# Patient Record
Sex: Female | Born: 1965 | Race: White | Hispanic: No | Marital: Single | Smoking: Former smoker
Health system: Southern US, Community
[De-identification: ages and names within clinical notes are randomized; demographics above are authoritative.]

## PROBLEM LIST (undated history)

## (undated) DIAGNOSIS — F0781 Postconcussional syndrome: Secondary | ICD-10-CM

## (undated) DIAGNOSIS — E7800 Pure hypercholesterolemia, unspecified: Secondary | ICD-10-CM

## (undated) DIAGNOSIS — S069XAA Unspecified intracranial injury with loss of consciousness status unknown, initial encounter: Secondary | ICD-10-CM

## (undated) DIAGNOSIS — S060XAA Concussion with loss of consciousness status unknown, initial encounter: Secondary | ICD-10-CM

## (undated) DIAGNOSIS — G4700 Insomnia, unspecified: Secondary | ICD-10-CM

## (undated) DIAGNOSIS — Z833 Family history of diabetes mellitus: Secondary | ICD-10-CM

## (undated) DIAGNOSIS — J45909 Unspecified asthma, uncomplicated: Secondary | ICD-10-CM

## (undated) DIAGNOSIS — J45990 Exercise induced bronchospasm: Secondary | ICD-10-CM

## (undated) DIAGNOSIS — R531 Weakness: Secondary | ICD-10-CM

## (undated) DIAGNOSIS — Z8249 Family history of ischemic heart disease and other diseases of the circulatory system: Secondary | ICD-10-CM

## (undated) DIAGNOSIS — I10 Essential (primary) hypertension: Secondary | ICD-10-CM

## (undated) DIAGNOSIS — Z9109 Other allergy status, other than to drugs and biological substances: Secondary | ICD-10-CM

## (undated) DIAGNOSIS — N898 Other specified noninflammatory disorders of vagina: Secondary | ICD-10-CM

## (undated) DIAGNOSIS — E669 Obesity, unspecified: Secondary | ICD-10-CM

## (undated) DIAGNOSIS — K589 Irritable bowel syndrome without diarrhea: Secondary | ICD-10-CM

## (undated) DIAGNOSIS — S060X9A Concussion with loss of consciousness of unspecified duration, initial encounter: Secondary | ICD-10-CM

## (undated) DIAGNOSIS — S069X9A Unspecified intracranial injury with loss of consciousness of unspecified duration, initial encounter: Secondary | ICD-10-CM

## (undated) HISTORY — DX: Irritable bowel syndrome, unspecified: K58.9

## (undated) HISTORY — DX: Concussion with loss of consciousness of unspecified duration, initial encounter: S06.0X9A

## (undated) HISTORY — DX: Family history of diabetes mellitus: Z83.3

## (undated) HISTORY — DX: Obesity, unspecified: E66.9

## (undated) HISTORY — DX: Other allergy status, other than to drugs and biological substances: Z91.09

## (undated) HISTORY — DX: Pure hypercholesterolemia, unspecified: E78.00

## (undated) HISTORY — DX: Concussion with loss of consciousness status unknown, initial encounter: S06.0XAA

## (undated) HISTORY — DX: Exercise induced bronchospasm: J45.990

## (undated) HISTORY — DX: Postconcussional syndrome: F07.81

## (undated) HISTORY — DX: Family history of ischemic heart disease and other diseases of the circulatory system: Z82.49

## (undated) HISTORY — DX: Weakness: R53.1

## (undated) HISTORY — DX: Insomnia, unspecified: G47.00

## (undated) HISTORY — PX: TUBAL LIGATION: SHX77

## (undated) HISTORY — DX: Other specified noninflammatory disorders of vagina: N89.8

---

## 1998-03-07 ENCOUNTER — Ambulatory Visit (HOSPITAL_COMMUNITY): Admission: RE | Admit: 1998-03-07 | Discharge: 1998-03-07 | Payer: Self-pay | Admitting: Obstetrics and Gynecology

## 2000-03-29 ENCOUNTER — Other Ambulatory Visit: Admission: RE | Admit: 2000-03-29 | Discharge: 2000-03-29 | Payer: Self-pay | Admitting: Obstetrics and Gynecology

## 2001-03-08 ENCOUNTER — Ambulatory Visit (HOSPITAL_BASED_OUTPATIENT_CLINIC_OR_DEPARTMENT_OTHER): Admission: RE | Admit: 2001-03-08 | Discharge: 2001-03-08 | Payer: Self-pay | Admitting: Orthopedic Surgery

## 2001-04-10 ENCOUNTER — Other Ambulatory Visit: Admission: RE | Admit: 2001-04-10 | Discharge: 2001-04-10 | Payer: Self-pay | Admitting: Obstetrics and Gynecology

## 2001-10-19 ENCOUNTER — Emergency Department (HOSPITAL_COMMUNITY): Admission: EM | Admit: 2001-10-19 | Discharge: 2001-10-19 | Payer: Self-pay

## 2001-10-20 ENCOUNTER — Encounter: Payer: Self-pay | Admitting: Emergency Medicine

## 2002-07-11 ENCOUNTER — Other Ambulatory Visit: Admission: RE | Admit: 2002-07-11 | Discharge: 2002-07-11 | Payer: Self-pay | Admitting: Obstetrics and Gynecology

## 2003-02-11 ENCOUNTER — Inpatient Hospital Stay (HOSPITAL_COMMUNITY): Admission: EM | Admit: 2003-02-11 | Discharge: 2003-03-05 | Payer: Self-pay

## 2003-02-11 ENCOUNTER — Encounter: Payer: Self-pay | Admitting: General Surgery

## 2003-02-20 ENCOUNTER — Encounter: Payer: Self-pay | Admitting: Surgery

## 2003-03-05 ENCOUNTER — Inpatient Hospital Stay (HOSPITAL_COMMUNITY)
Admission: RE | Admit: 2003-03-05 | Discharge: 2003-04-17 | Payer: Self-pay | Admitting: Physical Medicine & Rehabilitation

## 2003-04-24 ENCOUNTER — Encounter
Admission: RE | Admit: 2003-04-24 | Discharge: 2003-05-31 | Payer: Self-pay | Admitting: Physical Medicine & Rehabilitation

## 2003-05-22 ENCOUNTER — Encounter
Admission: RE | Admit: 2003-05-22 | Discharge: 2003-08-20 | Payer: Self-pay | Admitting: Physical Medicine & Rehabilitation

## 2003-06-01 ENCOUNTER — Encounter
Admission: RE | Admit: 2003-06-01 | Discharge: 2003-08-30 | Payer: Self-pay | Admitting: Physical Medicine & Rehabilitation

## 2003-09-12 ENCOUNTER — Encounter
Admission: RE | Admit: 2003-09-12 | Discharge: 2003-12-11 | Payer: Self-pay | Admitting: Physical Medicine & Rehabilitation

## 2004-04-27 ENCOUNTER — Encounter
Admission: RE | Admit: 2004-04-27 | Discharge: 2004-07-23 | Payer: Self-pay | Admitting: Physical Medicine & Rehabilitation

## 2004-04-28 ENCOUNTER — Ambulatory Visit: Payer: Self-pay | Admitting: Physical Medicine & Rehabilitation

## 2004-07-09 ENCOUNTER — Encounter: Admission: RE | Admit: 2004-07-09 | Discharge: 2004-10-07 | Payer: Self-pay | Admitting: Psychology

## 2004-07-23 ENCOUNTER — Encounter
Admission: RE | Admit: 2004-07-23 | Discharge: 2004-10-16 | Payer: Self-pay | Admitting: Physical Medicine & Rehabilitation

## 2004-07-27 ENCOUNTER — Ambulatory Visit: Payer: Self-pay | Admitting: Physical Medicine & Rehabilitation

## 2004-09-12 ENCOUNTER — Emergency Department (HOSPITAL_COMMUNITY): Admission: EM | Admit: 2004-09-12 | Discharge: 2004-09-12 | Payer: Self-pay | Admitting: Emergency Medicine

## 2004-10-16 ENCOUNTER — Encounter
Admission: RE | Admit: 2004-10-16 | Discharge: 2005-01-14 | Payer: Self-pay | Admitting: Physical Medicine & Rehabilitation

## 2004-10-20 ENCOUNTER — Ambulatory Visit: Payer: Self-pay | Admitting: Physical Medicine & Rehabilitation

## 2005-06-01 ENCOUNTER — Ambulatory Visit: Payer: Self-pay | Admitting: Physical Medicine & Rehabilitation

## 2005-06-01 ENCOUNTER — Encounter
Admission: RE | Admit: 2005-06-01 | Discharge: 2005-08-30 | Payer: Self-pay | Admitting: Physical Medicine & Rehabilitation

## 2005-07-08 ENCOUNTER — Ambulatory Visit: Payer: Self-pay | Admitting: Physical Medicine & Rehabilitation

## 2005-08-05 ENCOUNTER — Ambulatory Visit: Payer: Self-pay | Admitting: Physical Medicine & Rehabilitation

## 2005-11-02 ENCOUNTER — Encounter
Admission: RE | Admit: 2005-11-02 | Discharge: 2006-01-31 | Payer: Self-pay | Admitting: Physical Medicine & Rehabilitation

## 2005-11-02 ENCOUNTER — Ambulatory Visit: Payer: Self-pay | Admitting: Physical Medicine & Rehabilitation

## 2005-12-31 ENCOUNTER — Ambulatory Visit: Payer: Self-pay | Admitting: Family Medicine

## 2006-04-15 ENCOUNTER — Emergency Department (HOSPITAL_COMMUNITY): Admission: EM | Admit: 2006-04-15 | Discharge: 2006-04-15 | Payer: Self-pay | Admitting: Emergency Medicine

## 2006-04-18 ENCOUNTER — Ambulatory Visit: Payer: Self-pay | Admitting: Physical Medicine & Rehabilitation

## 2006-04-18 ENCOUNTER — Encounter
Admission: RE | Admit: 2006-04-18 | Discharge: 2006-07-17 | Payer: Self-pay | Admitting: Physical Medicine & Rehabilitation

## 2006-04-18 ENCOUNTER — Other Ambulatory Visit (HOSPITAL_COMMUNITY): Admission: RE | Admit: 2006-04-18 | Discharge: 2006-07-17 | Payer: Self-pay | Admitting: Psychiatry

## 2006-04-25 ENCOUNTER — Ambulatory Visit: Payer: Self-pay | Admitting: Psychiatry

## 2006-06-13 ENCOUNTER — Encounter
Admission: RE | Admit: 2006-06-13 | Discharge: 2006-09-11 | Payer: Self-pay | Admitting: Physical Medicine & Rehabilitation

## 2006-08-31 ENCOUNTER — Ambulatory Visit: Payer: Self-pay | Admitting: Physical Medicine & Rehabilitation

## 2006-11-07 ENCOUNTER — Ambulatory Visit: Payer: Self-pay | Admitting: Physical Medicine & Rehabilitation

## 2006-11-08 ENCOUNTER — Encounter
Admission: RE | Admit: 2006-11-08 | Discharge: 2007-02-06 | Payer: Self-pay | Admitting: Physical Medicine & Rehabilitation

## 2007-01-11 ENCOUNTER — Ambulatory Visit (HOSPITAL_COMMUNITY): Payer: Self-pay | Admitting: Psychology

## 2007-03-13 ENCOUNTER — Ambulatory Visit: Payer: Self-pay | Admitting: Physical Medicine & Rehabilitation

## 2007-03-13 ENCOUNTER — Encounter
Admission: RE | Admit: 2007-03-13 | Discharge: 2007-05-09 | Payer: Self-pay | Admitting: Physical Medicine & Rehabilitation

## 2007-08-07 ENCOUNTER — Encounter
Admission: RE | Admit: 2007-08-07 | Discharge: 2007-08-07 | Payer: Self-pay | Admitting: Physical Medicine & Rehabilitation

## 2007-11-16 ENCOUNTER — Emergency Department (HOSPITAL_COMMUNITY): Admission: EM | Admit: 2007-11-16 | Discharge: 2007-11-16 | Payer: Self-pay | Admitting: Emergency Medicine

## 2008-01-25 ENCOUNTER — Other Ambulatory Visit (HOSPITAL_COMMUNITY): Admission: RE | Admit: 2008-01-25 | Discharge: 2008-02-01 | Payer: Self-pay | Admitting: Psychiatry

## 2008-01-29 ENCOUNTER — Ambulatory Visit: Payer: Self-pay | Admitting: Psychiatry

## 2009-01-14 ENCOUNTER — Emergency Department (HOSPITAL_COMMUNITY): Admission: EM | Admit: 2009-01-14 | Discharge: 2009-01-14 | Payer: Self-pay | Admitting: Emergency Medicine

## 2010-09-05 LAB — URINALYSIS, ROUTINE W REFLEX MICROSCOPIC
Leukocytes, UA: NEGATIVE
Nitrite: NEGATIVE
Protein, ur: NEGATIVE mg/dL
Specific Gravity, Urine: 1.016 (ref 1.005–1.030)
Urobilinogen, UA: 0.2 mg/dL (ref 0.0–1.0)

## 2010-09-05 LAB — CBC
HCT: 40.8 % (ref 36.0–46.0)
MCV: 84.6 fL (ref 78.0–100.0)
Platelets: 286 10*3/uL (ref 150–400)
RDW: 15 % (ref 11.5–15.5)
WBC: 8.3 10*3/uL (ref 4.0–10.5)

## 2010-09-05 LAB — BASIC METABOLIC PANEL
Calcium: 10.1 mg/dL (ref 8.4–10.5)
Creatinine, Ser: 0.73 mg/dL (ref 0.4–1.2)
GFR calc Af Amer: >60 mL/min (ref >60)
GFR calc non Af Amer: >60 mL/min (ref >60)
Sodium: 139 mEq/L (ref 135–145)

## 2010-09-05 LAB — DIFFERENTIAL
Basophils Absolute: 0.1 10*3/uL (ref 0.0–0.1)
Eosinophils Absolute: 0.4 10*3/uL (ref 0.0–0.7)
Eosinophils Relative: 5 % (ref 0–5)
Neutrophils Relative %: 66 % (ref 43–77)

## 2010-09-05 LAB — URINE MICROSCOPIC-ADD ON

## 2010-10-13 NOTE — Assessment & Plan Note (Signed)
Shelley Hoffman is back regarding her brain injury.  She has had a pretty good  summer.  She states her mood has been better than what it has been in a  long time.  She really likes the Cymbalta .  She had some questions  about her Ritalin dosing and really has not been taking it as  prescribed.  I do not find another prescription since the June one I  filled at her last visit.  She uses the trazodone occasionally and uses  Aricept at night for her memory.  She has some applications pending at  two job sites.  They are both in customer service.  She is exercising  now.  Her daughter is in school.  She does note occasional agitation and  irritability but has some handle on it and is able to step back when  these episodes arise.   REVIEW OF SYSTEMS:  Notable for the above.  Full review is in the  written health and history section.   SOCIAL HISTORY:  Patient is single and living with her child.   PHYSICAL EXAMINATION:  Blood pressure is 110/68, pulse 72, respiratory  rate 18.  She is satting at 98% on room air.  Patient is pleasant and alert.  She is generally appropriate.  She does  need some cueing at times with organization and recall but for the most  part remains focused.  She has some better insight overall.  She has her  memory book with her today with her appointments, information, etc.  HEART:  Regular.  CHEST:  Clear.  She remains overweight.   ASSESSMENT:  1. Status post traumatic brain injury.  2. Depression.  3. Post concussion syndrome.  4. History of drug abuse.  5. Hypertension.   PLAN:  1. Continue vocational efforts.  2. Made Cymbalta at 60 mg per day.  3. Refill Ritalin 10 mg twice daily, avoiding the second dose if it is      after 2 p.m.  I gave her refills for next month.  4. Continue trazodone p.r.n. and Aricept scheduled.  5. I will see her back in six months.      Ranelle Oyster, M.D.  Electronically Signed     ZTS/MedQ  D:  03/14/2007 13:11:03  T:   03/15/2007 09:07:20  Job #:  [id number]

## 2010-10-13 NOTE — Assessment & Plan Note (Signed)
HISTORY OF PRESENT ILLNESS:  Shelley Hoffman is back regarding her brain injury.  She is still attending brain injury support group meetings. She is  volunteering at a homeless shelter and looking for permanent work. She  seems to have put a lot of her past problems behind her. Her mood has  been much better with the Cymbalta. She has come off Concerta over the  last couple of months, as she ran out of the medication and does note  that her energy may be a bit decreased. She asks if there are generic  versions available. She uses her Aricept to help her memory. She reports  decreased sleep at times and is often up until 2 or 3 in the morning and  she will sleep until 9:00 o'clock the following day.   REVIEW OF SYSTEMS:  Pertinent positives listed above. Full reviews in  the written health and history section.   SOCIAL HISTORY:  Without significant change. She has a sister, I  believe, who is very involved in her care and well being.   PHYSICAL EXAMINATION:  VITAL SIGNS:  Blood pressure 137/91, pulse 82,  respiratory rate 18. She is sating 97% on room air.  GENERAL:  The patient is very pleasant. No acute distress. Alert and  oriented times three. Affect is generally bright and appropriate. She  still has difficulty remembering and organizing information but she  writes things down and seems to be a bit more focused overall. She  responds to criticism and suggestion now, much better than she had  previously. She uses her memory book effectively.  CHEST:  Clear.  HEART:  Regular.  ABDOMEN:  Soft, nontender. She remains overweight.   ASSESSMENT:  1. Status post traumatic brain injury.  2. Depression.  3. Post concussion syndrome.  4. History of drug abuse.  5. Hypertension.   PLAN:  1. Encourage ongoing vocational and social activities. I support her      trying to find a job, even if it is part-time.  2. Continue Cymbalta at current dosing.  3. Will begin trial of Ritalin 10 mg twice daily  at 7:00 a.m. and 12      noon, or appropriate times based on her awakening in the morning.  4. Begin trial of Trazadone 50 mg to 100 mg q.h.s. for sleep.  5. Maintain Aricept at 10 mg q.h.s. for memory.  6. I will see her back in about 4 months time.      Ranelle Oyster, M.D.  Electronically Signed     ZTS/MedQ  D:  11/14/2006 14:54:03  T:  11/14/2006 20:34:03  Job #:  [id number]

## 2010-10-16 NOTE — Group Therapy Note (Signed)
Shelley Hoffman is here in followup of her traumatic brain injury suffered on February 12, 2003.  Shelley Hoffman has completed her outpatient therapies at this point.  She  has really been doing quite well at home.  Mom notes no problems with  behavior, appetite, or mood.  She has been sleeping well.  She denies any  loss of balance or weakness.  Shelley Hoffman also denies any problems with vision or  headache.  She is continent of bowel and bladder.  She remains on her  bromocriptine at 2.5 mg and Aricept, which is 10 mg currently.  Shelley Hoffman has  gone home a few times and helped clean up her house.  She has been managing  some of her own bills as well.  Her mother reports decreased  distractibility.  She feels that her memory is improving slowly.   REVIEW OF SYSTEMS:  The patient denies any chest pain, shortness of breath,  wheezing, and coughing symptoms.  No vertigo or new weakness or numbness in  the lower extremities.  No anxiety or depression.  No nausea, vomiting,  constipation, or diarrhea.  No swelling is noted.  No weight loss or gain is  reported.   PHYSICAL EXAMINATION:  On physical examination today, the patient is  pleasant and in no acute distress.  The blood pressure is 135/87 and the  pulse is 67.  She is saturating 100% on room air.  The patient was able to  recall today's date.  She need cues for the vice-president's name.  She was  able to remember 2/3 objects after five minutes and 1/3 objects after 10  minutes.  She had some difficulty sequencing today.  She was able to  sequence simple numbers, such as 2, 4, 6, but had difficulty with four and  nine number spreads.  She was able to spell the word treasure forward, but  left out the e going backwards.  Her abstract thinking was generally  appropriate.  The patient was able to maintain focus for the most part and  her behavior was appropriate throughout the exam.  On cranial nerve  examination, found some loss of her left peripheral fields in a  crescent-  shape distribution similar to her prior exam.  The motor exam was 5/5.  The  sensory exam was intact.  Reflexes were 2+.  She did have some difficulty  walking heel to toes slightly today with some minimal loss of balance,  although this was improved.   ASSESSMENT:  1. Status post traumatic brain injury with improving memory and attention     deficits.  2. Gait disorder, improving.  3. Left-sided peripheral field loss.   PLAN:  1. The patient continues to make progress, but is not appropriate in my     opinion to return to work at this point nor to drive.  I would like to     initiate her vocation rehabilitation.  2. I would like to send the patient for specific neuropsychological testing     by Dr. Leonides Cave so we may determine the appropriateness of her     transitioning back to job.  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  3. I did  give her clearance to be at home alone as I think she is     appropriate to function in her own household.  She will need help with     transportation, however.  4. Will continue her Aricept at 10 mg nightly for her memory.  5. As her concentration seems to be improved, I will stop the bromocriptine     and observe for now.  6. I will see her back in approximately two months' time.     Ranelle Oyster, M.D.   ZTS/MedQ  D:  07/15/2003 14:48:58  T:  07/15/2003 15:15:48  Job #:  [id number]   cc:   Gladstone Pih, Ph.D.  [address] [place]  [place] [address]   Julien Nordmann. Williams  P.A. [employer]  [address]  Fax [phone]

## 2010-10-16 NOTE — Op Note (Signed)
. [facility]  Patient:    Shelley Hoffman, Shelley Hoffman Visit Number: [id number] MRN: [id number]          Service Type: DSU Location: [facility] Attending Physician:  Milly Jakob Dictated by:   Harvie Junior, M.D. Proc. Date: 03/08/01 Admit Date:  03/08/2001 Discharge Date: 03/08/2001                             Operative Report  PREOPERATIVE DIAGNOSIS:  Carpal tunnel syndrome, right.  POSTOPERATIVE DIAGNOSIS:  Carpal tunnel syndrome, right.  PROCEDURE:  Right carpal tunnel release.  SURGEON:  Harvie Junior, M.D.  ASSISTANT:  ______ .  ANESTHESIA:  Forearm-based IV regional.  BRIEF HISTORY:  This is a 45 year old female with a long history of having carpal tunnel-type symptoms on the right side.  Ultimately evaluated her and tried conservative care but her symptoms increased over time.  Injections seems to help significantly but ultimately failed.  Because of continued complaints of pain, numbness and tingling, and failure of conservative care, the patient was taken to the operating room for a release of her carpal tunnel.  DESCRIPTION OF PROCEDURE:  The patient was taken to the operating room and after adequate anesthesia was obtained with general anesthetic, the patient was placed on the operating table where the right arm was prepped and draped in the usual sterile fashion.  Following Esmarch exsanguination of the extremity, blood pressure tourniquet was inflated to 250 mmHg.  A forearm-based IV regional was performed prior to this.  The patients arm was prepped and draped in the usual sterile fashion and a curved incision was made in line with the fourth digit just ulnar to the midline wrist crease.  The subcutaneous tissue was dissected down to the level of the volar carpal ligament which was identified and divided carefully.  The median nerve was identified below.  A Freer elevator was put in to make sure that the nerve was not adherent to the  undersurface of the ligament.  The ligament was then divided both proximally and distally and a gloved finger could be put into the wound to make sure that it was clearly divided.  The ligament tissue was markedly thickened and the nerve below had a sort of an hourglassing and a kind of a bunched up effect.  Minimal amount of neurolysis was undertaken to allow this kinked area of the nerve to be flattened out.  Following this, the wound was copiously irrigated and suctioned dry.  The wound was closed with a combination of interrupted and running suture.  Sterile compressive dressing was applied.  The patient was taken to the recovery room and she was noted to be in satisfactory condition.  Estimated blood loss for procedure was none. Dictated by:   Harvie Junior, M.D. Attending Physician:  Milly Jakob DD:  03/08/01 TD:  03/08/01 Job: [id number]

## 2012-06-16 ENCOUNTER — Emergency Department (HOSPITAL_COMMUNITY)
Admission: EM | Admit: 2012-06-16 | Discharge: 2012-06-16 | Disposition: A | Discharge status: Home / Self Care | Payer: Medicare Other | Attending: Emergency Medicine | Admitting: Emergency Medicine

## 2012-06-16 ENCOUNTER — Encounter (HOSPITAL_COMMUNITY): Payer: Self-pay | Admitting: *Deleted

## 2012-06-16 DIAGNOSIS — I10 Essential (primary) hypertension: Secondary | ICD-10-CM | POA: Insufficient documentation

## 2012-06-16 DIAGNOSIS — Z202 Contact with and (suspected) exposure to infections with a predominantly sexual mode of transmission: Secondary | ICD-10-CM

## 2012-06-16 DIAGNOSIS — Z87891 Personal history of nicotine dependence: Secondary | ICD-10-CM | POA: Insufficient documentation

## 2012-06-16 DIAGNOSIS — B9689 Other specified bacterial agents as the cause of diseases classified elsewhere: Secondary | ICD-10-CM

## 2012-06-16 DIAGNOSIS — Z87820 Personal history of traumatic brain injury: Secondary | ICD-10-CM | POA: Insufficient documentation

## 2012-06-16 DIAGNOSIS — A5901 Trichomonal vulvovaginitis: Secondary | ICD-10-CM | POA: Insufficient documentation

## 2012-06-16 DIAGNOSIS — Z3202 Encounter for pregnancy test, result negative: Secondary | ICD-10-CM | POA: Insufficient documentation

## 2012-06-16 DIAGNOSIS — N760 Acute vaginitis: Secondary | ICD-10-CM

## 2012-06-16 DIAGNOSIS — L293 Anogenital pruritus, unspecified: Secondary | ICD-10-CM | POA: Insufficient documentation

## 2012-06-16 HISTORY — DX: Unspecified intracranial injury with loss of consciousness of unspecified duration, initial encounter: S06.9X9A

## 2012-06-16 HISTORY — DX: Unspecified intracranial injury with loss of consciousness status unknown, initial encounter: S06.9XAA

## 2012-06-16 HISTORY — DX: Essential (primary) hypertension: I10

## 2012-06-16 LAB — URINALYSIS, ROUTINE W REFLEX MICROSCOPIC
Bilirubin Urine: NEGATIVE
Glucose, UA: NEGATIVE mg/dL
Nitrite: NEGATIVE
Specific Gravity, Urine: >1.03 — ABNORMAL HIGH (ref 1.005–1.030)
pH: 5.5 (ref 5.0–8.0)

## 2012-06-16 LAB — WET PREP, GENITAL: Trich, Wet Prep: NONE SEEN

## 2012-06-16 LAB — URINE MICROSCOPIC-ADD ON

## 2012-06-16 MED ORDER — METRONIDAZOLE 500 MG PO TABS
500.0000 mg | ORAL_TABLET | Freq: Once | ORAL | Status: AC
  Administered 2012-06-16: 500 mg via ORAL
  Filled 2012-06-16: qty 1

## 2012-06-16 MED ORDER — AZITHROMYCIN 250 MG PO TABS
1000.0000 mg | ORAL_TABLET | Freq: Once | ORAL | Status: AC
  Administered 2012-06-16: 1000 mg via ORAL
  Filled 2012-06-16: qty 4

## 2012-06-16 MED ORDER — METRONIDAZOLE 500 MG PO TABS: 500.0000 mg | ORAL_TABLET | Freq: Two times a day (BID) | ORAL | Status: DC

## 2012-06-16 MED ORDER — CEFTRIAXONE SODIUM 250 MG IJ SOLR
250.0000 mg | Freq: Once | INTRAMUSCULAR | Status: AC
  Administered 2012-06-16: 250 mg via INTRAMUSCULAR
  Filled 2012-06-16: qty 250

## 2012-06-16 NOTE — ED Notes (Signed)
Reports vaginal discharge and itching x 2 weeks; states discharge is clear.  Reports unprotected sex with one partner.

## 2012-06-16 NOTE — ED Provider Notes (Signed)
History     CSN: [id number]  Arrival date & time 06/16/12  7829   First MD Initiated Contact with Patient 06/16/12 [phone]      Chief Complaint  Patient presents with  . Vaginal Discharge  . Vaginal Itching    (Consider location/radiation/quality/duration/timing/severity/associated sxs/prior treatment) HPI Comments: 47 y/o female presents to the ED complaining of vaginal discharge and itching for the past 2 weeks. Describes discharge as thick and white appearing yellow on her underpants. Admits to unprotected intercourse with one partner, however does not know how many others he has been with recently which is a concern to her. Denies vaginal pain or bleeding. No dysuria, increased frequency or urgency, fever, chills, abdominal pain, nausea or vomiting. No specific aggravating or alleviating factors.  Patient is a 47 y.o. female presenting with vaginal discharge and vaginal itching. The history is provided by the patient.  Vaginal Discharge Pertinent negatives include no abdominal pain, chills, fever, nausea or vomiting.  Vaginal Itching Pertinent negatives include no abdominal pain, chills, fever, nausea or vomiting.    Past Medical History  Diagnosis Date  . Hypertension   . Traumatic brain injury     Past Surgical History  Procedure Date  . Tubal ligation     No family history on file.  History  Substance Use Topics  . Smoking status: Former Games developer  . Smokeless tobacco: Not on file  . Alcohol Use: No    OB History    Grav Para Term Preterm Abortions TAB SAB Ect Mult Living                  Review of Systems  Constitutional: Negative for fever and chills.  Gastrointestinal: Negative for nausea, vomiting and abdominal pain.  Genitourinary: Positive for vaginal discharge. Negative for dysuria, urgency, frequency, vaginal bleeding and vaginal pain.  All other systems reviewed and are negative.    Allergies  Review of patient's allergies indicates no known  allergies.  Home Medications  No current outpatient prescriptions on file.  BP 142/85  Pulse 81  Temp 97.8 F (36.6 C) (Oral)  Resp 20  Ht 5' 7.5" (1.715 m)  Wt 231 lb (104.781 kg)  BMI 35.65 kg/m2  SpO2 94%  LMP 05/28/2012  Physical Exam  Nursing note and vitals reviewed. Constitutional: She is oriented to person, place, and time. She appears well-developed and well-nourished.  HENT:  Head: Normocephalic and atraumatic.  Mouth/Throat: Oropharynx is clear and moist.  Eyes: Conjunctivae normal and EOM are normal.  Neck: Normal range of motion. Neck supple.  Cardiovascular: Normal rate, regular rhythm and normal heart sounds.   Pulmonary/Chest: Effort normal and breath sounds normal.  Abdominal: Bowel sounds are normal. There is no tenderness.  Genitourinary: Uterus normal. Cervix exhibits discharge. Cervix exhibits no motion tenderness and no friability. Right adnexum displays no mass, no tenderness and no fullness. Left adnexum displays no mass, no tenderness and no fullness. No erythema, tenderness or bleeding around the vagina. Vaginal discharge (copious, thick, yellow) found.  Musculoskeletal: Normal range of motion. She exhibits no edema.  Neurological: She is alert and oriented to person, place, and time.  Skin: Skin is warm and dry. No rash noted.  Psychiatric: She has a normal mood and affect. Her behavior is normal.    ED Course  Procedures (including critical care time)  Labs Reviewed  URINALYSIS, ROUTINE W REFLEX MICROSCOPIC - Abnormal; Notable for the following:    APPearance CLOUDY (*)     Specific Gravity, Urine >  1.030 (*)     Hgb urine dipstick TRACE (*)     Protein, ur 30 (*)     Leukocytes, UA MODERATE (*)     All other components within normal limits  WET PREP, GENITAL - Abnormal; Notable for the following:    Clue Cells Wet Prep HPF POC MANY (*)     WBC, Wet Prep HPF POC MANY (*)     All other components within normal limits  URINE MICROSCOPIC-ADD ON  - Abnormal; Notable for the following:    Squamous Epithelial / LPF MANY (*)     Bacteria, UA MANY (*)     All other components within normal limits  PREGNANCY, URINE  GC/CHLAMYDIA PROBE AMP  URINE CULTURE   No results found.   1. Trichomonas vaginitis   2. BV (bacterial vaginosis)   3. Possible exposure to STD       MDM  47 year old female with trichomoniasis, bacterial vaginosis and concerned for sexually transmitted diseases. No cervical motion tenderness on exam. 250 mg IM Rocephin and 1 g azithromycin given in the ED today. She was also given her first dose of Flagyl. She is aware to inform her sexual partner. Patient states her understanding of plan and is agreeable. Return precautions discussed.        Trevor Mace, PA-C 06/16/12 1132

## 2012-06-16 NOTE — ED Notes (Signed)
Pt reports that she has had vaginal itching and discharge for 2 weeks.  States that the discharge is clear and white.  States that the discharge does not have an odor.

## 2012-06-16 NOTE — ED Provider Notes (Signed)
Medical screening examination/treatment/procedure(s) were performed by non-physician practitioner and as supervising physician I was immediately available for consultation/collaboration. Devoria Albe, MD, Armando Gang   Ward Givens, MD 06/16/12 [phone]

## 2012-06-17 LAB — URINE CULTURE: Colony Count: NO GROWTH

## 2012-06-18 LAB — GC/CHLAMYDIA PROBE AMP: GC Probe RNA: NEGATIVE

## 2012-10-19 ENCOUNTER — Encounter (HOSPITAL_COMMUNITY): Payer: Self-pay | Admitting: Cardiovascular Disease

## 2012-10-20 ENCOUNTER — Other Ambulatory Visit (HOSPITAL_COMMUNITY): Payer: Self-pay | Admitting: Cardiovascular Disease

## 2012-10-20 DIAGNOSIS — R011 Cardiac murmur, unspecified: Secondary | ICD-10-CM

## 2012-10-20 DIAGNOSIS — R079 Chest pain, unspecified: Secondary | ICD-10-CM

## 2012-10-26 ENCOUNTER — Ambulatory Visit (HOSPITAL_COMMUNITY)
Admission: RE | Admit: 2012-10-26 | Discharge: 2012-10-26 | Disposition: A | Discharge status: Home / Self Care | Payer: Medicare Other | Source: Ambulatory Visit | Attending: Cardiovascular Disease | Admitting: Cardiovascular Disease

## 2012-10-26 DIAGNOSIS — R079 Chest pain, unspecified: Secondary | ICD-10-CM | POA: Insufficient documentation

## 2012-10-26 DIAGNOSIS — R0602 Shortness of breath: Secondary | ICD-10-CM | POA: Insufficient documentation

## 2012-10-26 DIAGNOSIS — E669 Obesity, unspecified: Secondary | ICD-10-CM | POA: Insufficient documentation

## 2012-10-26 DIAGNOSIS — R011 Cardiac murmur, unspecified: Secondary | ICD-10-CM

## 2012-10-26 DIAGNOSIS — R42 Dizziness and giddiness: Secondary | ICD-10-CM | POA: Insufficient documentation

## 2012-10-26 DIAGNOSIS — R0609 Other forms of dyspnea: Secondary | ICD-10-CM | POA: Insufficient documentation

## 2012-10-26 DIAGNOSIS — R0989 Other specified symptoms and signs involving the circulatory and respiratory systems: Secondary | ICD-10-CM | POA: Insufficient documentation

## 2012-10-26 DIAGNOSIS — I10 Essential (primary) hypertension: Secondary | ICD-10-CM | POA: Insufficient documentation

## 2012-10-26 MED ORDER — TECHNETIUM TC 99M SESTAMIBI GENERIC - CARDIOLITE
30.4000 | Freq: Once | INTRAVENOUS | Status: AC | PRN
  Administered 2012-10-26: 30 via INTRAVENOUS

## 2012-10-26 MED ORDER — TECHNETIUM TC 99M SESTAMIBI GENERIC - CARDIOLITE
11.0000 | Freq: Once | INTRAVENOUS | Status: AC | PRN
  Administered 2012-10-26: 11 via INTRAVENOUS

## 2012-10-26 NOTE — Progress Notes (Signed)
[facility]   2D echo completed 10/26/2012.   Cindy Joydan Gretzinger, RDCS  

## 2012-10-26 NOTE — Procedures (Addendum)
[facility] [address] [place] [address] [place] [address] [phone]  Cardiology Nuclear Med Study  Shelley Hoffman is a 47 y.o. female     MRN : [id number]     DOB: [birth year]  Procedure Date: 10/26/2012  Nuclear Med Background Indication for Stress Test:  Evaluation for Ischemia History:  Asthma Cardiac Risk Factors: Family History - CAD, History of Smoking, Hypertension, Lipids and Obesity  Symptoms:  Chest Pain, DOE, Light-Headedness and SOB   Nuclear Pre-Procedure Caffeine/Decaff Intake:  1:00am NPO After: 11 AM   IV Site: R Forearm  IV 0.9% NS with Angio Cath:  22g  Chest Size (in):  N/A IV Started by: Emmit Pomfret, RN  Height: 5\' 6"  (1.676 m)  Cup Size: DD  BMI:  Body mass index is 36.17 kg/(m^2). Weight:  224 lb (101.606 kg)   Tech Comments:  N/A    Nuclear Med Study 1 or 2 day study: 1 day  Stress Test Type:  Stress  Order Authorizing Provider:  Susa Griffins, MD   Resting Radionuclide: Technetium 22m Sestamibi  Resting Radionuclide Dose: 11.0 mCi   Stress Radionuclide:  Technetium 6m Sestamibi  Stress Radionuclide Dose: 30.4 mCi           Stress Protocol Rest HR: 82 Stress HR: 155  Rest BP: 135/96 Stress BP:220/115  Exercise Time (min): 10:20 METS: 10.80          Dose of Adenosine (mg):  n/a Dose of Lexiscan: n/a mg  Dose of Atropine (mg): n/a Dose of Dobutamine: n/a mcg/kg/min (at max HR)  Stress Test Technologist: Ernestene Mention, CCT Nuclear Technologist: Gonzella Lex, CNMT   Rest Procedure:  Myocardial perfusion imaging was performed at rest 45 minutes following the intravenous administration of Technetium 48m Sestamibi. Stress Procedure:  The patient performed treadmill exercise using a Bruce  Protocol for 10 minutes and 20 seconds. The patient stopped due to shortness of breath and fatigue. Patient denied any chest pain.  There were no significant ST-T wave changes.  Technetium 69m Sestamibi was  injected at peak exercise and myocardial perfusion imaging was performed after a brief delay.  Transient Ischemic Dilatation (Normal <1.22):  0.89 Lung/Heart Ratio (Normal <0.45):  0.21 QGS EDV:  59 ml QGS ESV:  15 ml LV Ejection Fraction: 74%  Signed by       Rest ECG: NSR - Normal EKG  Stress ECG: No significant change from baseline ECG  QPS Raw Data Images:  Normal; no motion artifact; normal heart/lung ratio. Stress Images:  Normal homogeneous uptake in all areas of the myocardium. Rest Images:  Normal homogeneous uptake in all areas of the myocardium. Subtraction (SDS):  No evidence of ischemia.  Impression Exercise Capacity:  Excellent exercise capacity. BP Response:  Normal blood pressure response. Clinical Symptoms:  No significant symptoms noted. ECG Impression:  No significant ST segment change suggestive of ischemia. Comparison with Prior Nuclear Study: No previous nuclear study performed  Overall Impression:  Normal stress nuclear study.  LV Wall Motion:  NL LV Function; NL Wall Motion   Runell Gess, MD  10/26/2012 5:22 PM

## 2012-11-02 ENCOUNTER — Encounter: Payer: Self-pay | Admitting: Cardiovascular Disease

## 2013-02-08 ENCOUNTER — Encounter (HOSPITAL_COMMUNITY): Payer: Self-pay | Admitting: Emergency Medicine

## 2013-02-08 ENCOUNTER — Emergency Department (HOSPITAL_COMMUNITY)
Admission: EM | Admit: 2013-02-08 | Discharge: 2013-02-09 | Disposition: A | Discharge status: Home / Self Care | Payer: Medicare Other | Attending: Emergency Medicine | Admitting: Emergency Medicine

## 2013-02-08 DIAGNOSIS — N898 Other specified noninflammatory disorders of vagina: Secondary | ICD-10-CM | POA: Insufficient documentation

## 2013-02-08 DIAGNOSIS — Z8619 Personal history of other infectious and parasitic diseases: Secondary | ICD-10-CM | POA: Insufficient documentation

## 2013-02-08 DIAGNOSIS — Z87891 Personal history of nicotine dependence: Secondary | ICD-10-CM | POA: Insufficient documentation

## 2013-02-08 DIAGNOSIS — Z79899 Other long term (current) drug therapy: Secondary | ICD-10-CM | POA: Insufficient documentation

## 2013-02-08 DIAGNOSIS — I10 Essential (primary) hypertension: Secondary | ICD-10-CM | POA: Insufficient documentation

## 2013-02-08 DIAGNOSIS — Z87828 Personal history of other (healed) physical injury and trauma: Secondary | ICD-10-CM | POA: Insufficient documentation

## 2013-02-08 MED ORDER — CEFTRIAXONE SODIUM 250 MG IJ SOLR
250.0000 mg | Freq: Once | INTRAMUSCULAR | Status: AC
  Administered 2013-02-08: 250 mg via INTRAMUSCULAR
  Filled 2013-02-08: qty 250

## 2013-02-08 MED ORDER — AZITHROMYCIN 250 MG PO TABS
1000.0000 mg | ORAL_TABLET | Freq: Once | ORAL | Status: AC
  Administered 2013-02-08: 1000 mg via ORAL
  Filled 2013-02-08: qty 4

## 2013-02-08 NOTE — ED Notes (Signed)
Pt c/o vaginal itching x5-6 days. Pt denies unusual vaginal discharge or bleeding. Pt also denies abdominal pain, unusual urinary symptoms, odor.

## 2013-02-08 NOTE — ED Provider Notes (Signed)
CSN: [id number]     Arrival date & time 02/08/13  2131 History   This chart was scribed for Geoffery Lyons, MD by Clydene Laming, ED Scribe and Bennett Scrape, ED Scribe. This patient was seen in room APA09/APA09 and the patient's care was started at 10:10 PM.   Chief Complaint  Patient presents with  . SEXUALLY TRANSMITTED DISEASE   The history is provided by the patient. No language interpreter was used.    HPI Comments: Shelley Hoffman is a 47 y.o. female who presents to the Emergency Department complaining of a constant, non-changing vaginal itch that has been occuring for the past 6 days. Pt was seen once before with the same complaint diagnosed as bacterial vaginosis and trichomonas.  She states that the symptoms resolved with medications. Pt states to have only one sex partner but is unsure of her partner's sexual activity level. She is unsure is she is experiencing an STD or a yeast infection. Pt denies any back pain and urinary symptoms.  Past Medical History  Diagnosis Date  . Hypertension   . Traumatic brain injury    Past Surgical History  Procedure Laterality Date  . Tubal ligation     No family history on file. History  Substance Use Topics  . Smoking status: Former Games developer  . Smokeless tobacco: Not on file  . Alcohol Use: No   No OB history provided.  Review of Systems  Gastrointestinal: Negative for abdominal pain and rectal pain.  Genitourinary: Negative for vaginal discharge, genital sores, vaginal pain and pelvic pain.  All other systems reviewed and are negative.    Allergies  Review of patient's allergies indicates no known allergies.  Home Medications   Current Outpatient Rx  Name  Route  Sig  Dispense  Refill  . b complex vitamins tablet   Oral   Take 1 tablet by mouth daily.         . cholecalciferol (VITAMIN D) 1000 UNITS tablet   Oral   Take 1,000 Units by mouth daily.         Marland Kitchen diltiazem (TIAZAC) 360 MG 24 hr capsule   Oral   Take 360 mg  by mouth daily.         Marland Kitchen HYDROcodone-acetaminophen (NORCO/VICODIN) 5-325 MG per tablet   Oral   Take 1 tablet by mouth at bedtime as needed for pain.         . Melatonin 3 MG TABS   Oral   Take 3 mg by mouth daily.         . methylphenidate (RITALIN) 10 MG tablet   Oral   Take 10 mg by mouth 2 (two) times daily.         . pravastatin (PRAVACHOL) 80 MG tablet   Oral   Take 80 mg by mouth daily.         . vitamin C (ASCORBIC ACID) 500 MG tablet   Oral   Take 500 mg by mouth daily.          Triage Vitals; BP 146/119  Pulse 86  Temp(Src) 97.9 F (36.6 C) (Oral)  Resp 20  Ht 5\' 7"  (1.702 m)  Wt 241 lb (109.317 kg)  BMI 37.74 kg/m2  SpO2 99%  LMP 01/21/2013  Physical Exam  Nursing note and vitals reviewed. Constitutional: She is oriented to person, place, and time. She appears well-developed and well-nourished. No distress.  HENT:  Head: Normocephalic and atraumatic.  Eyes: Conjunctivae and EOM are normal.  Neck: Normal range of motion. Neck supple. No tracheal deviation present.  Cardiovascular: Normal rate, regular rhythm and normal heart sounds.   No murmur heard. Pulmonary/Chest: Effort normal and breath sounds normal. No respiratory distress. She has no wheezes. She has no rales.  Abdominal: Soft. Bowel sounds are normal. There is no tenderness.  Genitourinary: Uterus normal. Vaginal discharge found.  Slight whitish discharge present.  Musculoskeletal: Normal range of motion. She exhibits no edema.  Neurological: She is alert and oriented to person, place, and time. No cranial nerve deficit.  Skin: Skin is warm and dry.  Psychiatric: She has a normal mood and affect. Her behavior is normal.    ED Course  Procedures (including critical care time)  DIAGNOSTIC STUDIES: Oxygen Saturation is 99% on room air, normal by my interpretation.    COORDINATION OF CARE: 10:16 PM- Discussed treatment plan with pt at bedside. Pt verbalized understanding and  agreement with plan.   Labs Review Labs Reviewed - No data to display Imaging Review No results found.  MDM  No diagnosis found. This patient is a 47 year old female presents with complaints of vaginal itching. She has a history of being treated for Trichomonas several months ago. She is concerned this has recurred. She is sexually active. Physical exam is unremarkable and pelvic exam reveals slight whitish discharge. Wet prep reveals many white blood cells but no yeast, clue cells, or trichomonas. I will treat with Rocephin and Zithromax pending culture. She is return when necessary if her symptoms worsen or change.   I personally performed the services described in this documentation, which was scribed in my presence. The recorded information has been reviewed and is accurate.      Geoffery Lyons, MD 02/08/13 [phone]

## 2013-02-08 NOTE — ED Notes (Signed)
Patient c/o vaginal itching since Sunday.  Patient states she doesn't know if it's a yeast infection or an STD.

## 2013-02-09 NOTE — ED Notes (Signed)
Discharge instructions given and reviewed with patient.  Patient verbalized understanding that she will be notified of culture and if positive, she has already been treated.

## 2013-02-14 LAB — GC/CHLAMYDIA PROBE AMP: CT Probe RNA: NEGATIVE

## 2013-06-05 ENCOUNTER — Encounter (HOSPITAL_COMMUNITY): Payer: Self-pay | Admitting: Emergency Medicine

## 2013-06-05 ENCOUNTER — Emergency Department (HOSPITAL_COMMUNITY)
Admission: EM | Admit: 2013-06-05 | Discharge: 2013-06-06 | Disposition: A | Discharge status: Home / Self Care | Payer: Medicare Other | Attending: Emergency Medicine | Admitting: Emergency Medicine

## 2013-06-05 DIAGNOSIS — X12XXXA Contact with other hot fluids, initial encounter: Secondary | ICD-10-CM | POA: Insufficient documentation

## 2013-06-05 DIAGNOSIS — Z79899 Other long term (current) drug therapy: Secondary | ICD-10-CM | POA: Insufficient documentation

## 2013-06-05 DIAGNOSIS — T22219A Burn of second degree of unspecified forearm, initial encounter: Secondary | ICD-10-CM | POA: Insufficient documentation

## 2013-06-05 DIAGNOSIS — X131XXA Other contact with steam and other hot vapors, initial encounter: Secondary | ICD-10-CM

## 2013-06-05 DIAGNOSIS — T23201A Burn of second degree of right hand, unspecified site, initial encounter: Secondary | ICD-10-CM

## 2013-06-05 DIAGNOSIS — I10 Essential (primary) hypertension: Secondary | ICD-10-CM | POA: Insufficient documentation

## 2013-06-05 DIAGNOSIS — Y93G3 Activity, cooking and baking: Secondary | ICD-10-CM | POA: Insufficient documentation

## 2013-06-05 DIAGNOSIS — Y9289 Other specified places as the place of occurrence of the external cause: Secondary | ICD-10-CM | POA: Insufficient documentation

## 2013-06-05 DIAGNOSIS — Z87891 Personal history of nicotine dependence: Secondary | ICD-10-CM | POA: Insufficient documentation

## 2013-06-05 DIAGNOSIS — Z87820 Personal history of traumatic brain injury: Secondary | ICD-10-CM | POA: Insufficient documentation

## 2013-06-05 DIAGNOSIS — T23269A Burn of second degree of back of unspecified hand, initial encounter: Secondary | ICD-10-CM | POA: Insufficient documentation
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
## 2013-06-06 MED ORDER — HYDROCODONE-ACETAMINOPHEN 5-325 MG PO TABS
2.0000 | ORAL_TABLET | Freq: Once | ORAL | Status: AC
  Administered 2013-06-06: 2 via ORAL
  Filled 2013-06-06: qty 2

## 2013-06-06 MED ORDER — OXYCODONE-ACETAMINOPHEN 5-325 MG PO TABS: 2.0000 | ORAL_TABLET | ORAL | Status: DC | PRN

## 2013-06-06 MED ORDER — SILVER SULFADIAZINE 1 % EX CREA: 1.0000 "application " | TOPICAL_CREAM | Freq: Every day | CUTANEOUS | Status: DC

## 2013-06-06 MED ORDER — SILVER SULFADIAZINE 1 % EX CREA
TOPICAL_CREAM | Freq: Once | CUTANEOUS | Status: AC
  Administered 2013-06-06: 1 via TOPICAL
  Filled 2013-06-06: qty 50
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
## 2013-06-06 NOTE — Discharge Instructions (Signed)
Call and make an appointment to followup with Dr. Kathie DikeeFranzo at [phone] in the next one to 2 days. Return immediately to the emergency department for uncontrolled pain, fever, increased redness or for any concerns.   Second-Degree Burn A second-degree burn affects the 2 outer layers of skin. The outer layer (epidermis) and the layer underneath it (dermis) are both burned. Another name for this type of burn is a partial thickness burn. A second-degree burn may be called minor or major. This depends on the size of the burn. It also depends on what parts of the skin are burned. Minor burns may be treated with first aid. Major burns are a medical emergency. A second-degree burn is worse than a first-degree burn, but not as bad as a third-degree burn. A first-degree burn affects only the epidermis. A third-degree burn goes through all the layers of skin. A second-degree burn usually heals in 3 to 4 weeks. A minor second-degree burn usually does not leave a scar.Deeper second-degree burns may lead to scarring of the skin or contractures over joints.Contractures are scars that form over joints and may lead to reduced mobility at those joints. CAUSES  Heat (thermal) injury. This happens when skin comes in contact with something very hot. It could be a flame, a hot object, hot liquid, or steam. Most second-degree burns are thermal injuries.  Radiation. Sunlight is one type of radiation that can burn the skin. Another type of radiation is used to heat food. Radiation is also used to treat some diseases, such as cancer. All types of radiation can burn the skin. Sunlight usually causes a first-degree burn. Radiation used for heating food or treating a disease can cause a second-degree burn.  Electricity. Electrical burns can cause more damage under the skin than on the surface. They should always be treated as major burns.  Chemicals. Many chemicals can burn the skin. The burn should be flushed with cool  water and checked by an emergency caregiver. SYMPTOMS Symptoms of second-degree burns include:  Severe pain.  Extreme tenderness.  Deep redness.  Blistered skin.  Skin that has changed color.It might look blotchy, wet, or shiny.  Swelling. TREATMENT Some second-degree burns may need to be treated in a hospital. These include major burns, electrical burns, and chemical burns. Many other second-degree burns can be treated with regular first aid, such as:  Cooling the burn. Use cool, germ-free (sterile) salt water. Place the burned area of skin into a tub of water, or cover the burned area with clean, wet towels.  Taking pain medicine.  Removing the dead skin from broken blisters. A trained caregiver may do this. Do not pop blisters.  Gently washing your skin with mild soap.  Covering the burned area with a cream.Silver sulfadiazine is a cream for burns. An antibiotic cream, such as bacitracin, may also be used to fight infection. Do not use other ointments or creams unless your caregiver says it is okay.  Protecting the burn with a sterile, non-sticky bandage.  Bandaging fingers and toes separately. This keeps them from sticking together.  Taking an antibiotic. This can help prevent infection.  Getting a tetanus shot. HOME CARE INSTRUCTIONS Medication  Take any medicine prescribed by your caregiver. Follow the directions carefully.  Ask your caregiver if you can take over-the-counter medicine to relieve pain and swelling. Do not give aspirin to children.  Make sure your caregiver knows about all other medicines you take.This includes over-the-counter medicines. Burn care  You will need to  change the bandage on your burn. You may need to do this 2 or 3 times each day.  Gently clean the burned area.  Put ointment on it.  Cover the burn with a sterile bandage.  For some deeper burns or burns that cover a large area, compression garments may be prescribed. These  garments can help minimize scarring and protect your mobility.  Do not put butter or oil on your skin. Use only the cream prescribed by your caregiver.  Do not put ice on your burn.  Do not break blisters on your skin.  Keep the bandaged area dry. You might need to take a sponge bath for awhile.Ask your caregiver when you can take a shower or a tub bath again.  Do not scratch an itchy burn. Your caregiver may give you medicine to relieve very bad itching.  Infection is a big danger after a second-degree burn. Tell your caregiver right away if you have signs of infection, such as:  Redness or changing color in the burned area.  Fluid leaking from the burn.  Swelling in the burn area.  A bad smell coming from the wound. Follow-up  Keep all follow-up appointments.This is important. This is how your caregiver can tell if your treatment is working.  Protect your burn from sunlight.Use sunscreen whenever you go outside.Burned areas may be sensitive to the sun for up to 1 year. Exposure to the sun may also cause permanent darkening of scars. SEEK MEDICAL CARE IF:  You have any questions about medicines.  You have any questions about your treatment.  You wonder if it is okay to do a particular activity.  You develop a fever of more than 100.5 F (38.1 C). SEEK IMMEDIATE MEDICAL CARE IF:  You think your burn might be infected. It may change color, become red, leak fluid, swell, or smell bad.  You develop a fever of more than 102 F (38.9 C). Document Released: 10/19/2010 Document Revised: 08/09/2011 Document Reviewed: 10/19/2010 [facility] Patient Information 2014 [place], [place].
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
## 2013-06-06 NOTE — ED Notes (Signed)
D/c instructions reviewed w/ pt - pt denies any further questions or concerns at present. Pt ambulating independently w/ steady gait on d/c in no acute distress, A&Ox4. Rx given x2

## 2015-01-22 ENCOUNTER — Encounter: Payer: Self-pay | Admitting: Cardiovascular Disease

## 2015-01-29 ENCOUNTER — Encounter: Payer: Self-pay | Admitting: Cardiovascular Disease

## 2017-04-07 ENCOUNTER — Ambulatory Visit (HOSPITAL_COMMUNITY)
Admission: RE | Admit: 2017-04-07 | Discharge: 2017-04-07 | Disposition: A | Discharge status: Home / Self Care | Payer: Medicare Other | Source: Ambulatory Visit | Attending: Family Medicine | Admitting: Family Medicine

## 2017-04-07 ENCOUNTER — Other Ambulatory Visit (HOSPITAL_COMMUNITY): Payer: Self-pay | Admitting: Family Medicine

## 2017-04-07 DIAGNOSIS — M25562 Pain in left knee: Secondary | ICD-10-CM

## 2017-06-03 DIAGNOSIS — G44209 Tension-type headache, unspecified, not intractable: Secondary | ICD-10-CM | POA: Diagnosis not present

## 2017-06-03 DIAGNOSIS — R69 Illness, unspecified: Secondary | ICD-10-CM | POA: Diagnosis not present

## 2017-06-03 DIAGNOSIS — Z79891 Long term (current) use of opiate analgesic: Secondary | ICD-10-CM | POA: Diagnosis not present

## 2017-06-03 DIAGNOSIS — I10 Essential (primary) hypertension: Secondary | ICD-10-CM | POA: Diagnosis not present

## 2017-06-13 DIAGNOSIS — M1712 Unilateral primary osteoarthritis, left knee: Secondary | ICD-10-CM | POA: Diagnosis not present

## 2017-06-15 DIAGNOSIS — M1712 Unilateral primary osteoarthritis, left knee: Secondary | ICD-10-CM | POA: Diagnosis not present

## 2017-07-07 ENCOUNTER — Inpatient Hospital Stay (HOSPITAL_COMMUNITY)
Admission: EM | Admit: 2017-07-07 | Discharge: 2017-07-09 | DRG: 203 | Disposition: A | Discharge status: Home / Self Care | Payer: Medicare HMO | Attending: Internal Medicine | Admitting: Internal Medicine

## 2017-07-07 ENCOUNTER — Other Ambulatory Visit: Payer: Self-pay

## 2017-07-07 ENCOUNTER — Emergency Department (HOSPITAL_COMMUNITY): Payer: Medicare HMO

## 2017-07-07 ENCOUNTER — Encounter (HOSPITAL_COMMUNITY): Payer: Self-pay

## 2017-07-07 DIAGNOSIS — T380X5A Adverse effect of glucocorticoids and synthetic analogues, initial encounter: Secondary | ICD-10-CM | POA: Diagnosis present

## 2017-07-07 DIAGNOSIS — Z833 Family history of diabetes mellitus: Secondary | ICD-10-CM

## 2017-07-07 DIAGNOSIS — E7800 Pure hypercholesterolemia, unspecified: Secondary | ICD-10-CM | POA: Diagnosis present

## 2017-07-07 DIAGNOSIS — R0602 Shortness of breath: Secondary | ICD-10-CM | POA: Diagnosis not present

## 2017-07-07 DIAGNOSIS — E669 Obesity, unspecified: Secondary | ICD-10-CM | POA: Diagnosis present

## 2017-07-07 DIAGNOSIS — E661 Drug-induced obesity: Secondary | ICD-10-CM | POA: Diagnosis not present

## 2017-07-07 DIAGNOSIS — E785 Hyperlipidemia, unspecified: Secondary | ICD-10-CM | POA: Diagnosis not present

## 2017-07-07 DIAGNOSIS — Z9851 Tubal ligation status: Secondary | ICD-10-CM

## 2017-07-07 DIAGNOSIS — R062 Wheezing: Secondary | ICD-10-CM | POA: Diagnosis not present

## 2017-07-07 DIAGNOSIS — Z6833 Body mass index (BMI) 33.0-33.9, adult: Secondary | ICD-10-CM | POA: Diagnosis not present

## 2017-07-07 DIAGNOSIS — J4541 Moderate persistent asthma with (acute) exacerbation: Principal | ICD-10-CM

## 2017-07-07 DIAGNOSIS — J45901 Unspecified asthma with (acute) exacerbation: Secondary | ICD-10-CM | POA: Insufficient documentation

## 2017-07-07 DIAGNOSIS — Z8249 Family history of ischemic heart disease and other diseases of the circulatory system: Secondary | ICD-10-CM | POA: Diagnosis not present

## 2017-07-07 DIAGNOSIS — I10 Essential (primary) hypertension: Secondary | ICD-10-CM | POA: Diagnosis present

## 2017-07-07 DIAGNOSIS — K589 Irritable bowel syndrome without diarrhea: Secondary | ICD-10-CM | POA: Diagnosis present

## 2017-07-07 DIAGNOSIS — Z87891 Personal history of nicotine dependence: Secondary | ICD-10-CM

## 2017-07-07 DIAGNOSIS — R739 Hyperglycemia, unspecified: Secondary | ICD-10-CM | POA: Diagnosis present

## 2017-07-07 DIAGNOSIS — G4700 Insomnia, unspecified: Secondary | ICD-10-CM | POA: Diagnosis present

## 2017-07-07 DIAGNOSIS — K219 Gastro-esophageal reflux disease without esophagitis: Secondary | ICD-10-CM | POA: Diagnosis not present

## 2017-07-07 DIAGNOSIS — R0902 Hypoxemia: Secondary | ICD-10-CM | POA: Diagnosis not present

## 2017-07-07 DIAGNOSIS — Z6836 Body mass index (BMI) 36.0-36.9, adult: Secondary | ICD-10-CM | POA: Diagnosis not present

## 2017-07-07 HISTORY — DX: Unspecified asthma, uncomplicated: J45.909

## 2017-07-07 LAB — CBC WITH DIFFERENTIAL/PLATELET
BASOS ABS: 0 10*3/uL (ref 0.0–0.1)
Basophils Relative: 0 %
EOS PCT: 1 %
Eosinophils Absolute: 0.1 10*3/uL (ref 0.0–0.7)
HCT: 43.5 % (ref 36.0–46.0)
Hemoglobin: 13.8 g/dL (ref 12.0–15.0)
LYMPHS PCT: 13 %
Lymphs Abs: 1.4 10*3/uL (ref 0.7–4.0)
MCH: 27.7 pg (ref 26.0–34.0)
MCHC: 31.7 g/dL (ref 30.0–36.0)
MCV: 87.3 fL (ref 78.0–100.0)
MONO ABS: 0.6 10*3/uL (ref 0.1–1.0)
MONOS PCT: 6 %
Neutro Abs: 8.8 10*3/uL — ABNORMAL HIGH (ref 1.7–7.7)
Neutrophils Relative %: 80 %
PLATELETS: 237 10*3/uL (ref 150–400)
RBC: 4.98 MIL/uL (ref 3.87–5.11)
RDW: 13.5 % (ref 11.5–15.5)
WBC: 11 10*3/uL — ABNORMAL HIGH (ref 4.0–10.5)

## 2017-07-07 LAB — BASIC METABOLIC PANEL
Anion gap: 11 (ref 5–15)
BUN: 13 mg/dL (ref 6–20)
CALCIUM: 10.4 mg/dL — AB (ref 8.9–10.3)
CO2: 24 mmol/L (ref 22–32)
CREATININE: 0.59 mg/dL (ref 0.44–1.00)
Chloride: 102 mmol/L (ref 101–111)
GFR calc Af Amer: >60 mL/min (ref >60)
Glucose, Bld: 135 mg/dL — ABNORMAL HIGH (ref 65–99)
Potassium: 3.6 mmol/L (ref 3.5–5.1)
Sodium: 137 mmol/L (ref 135–145)

## 2017-07-07 LAB — HEPATIC FUNCTION PANEL
ALBUMIN: 4.4 g/dL (ref 3.5–5.0)
ALK PHOS: 96 U/L (ref 38–126)
ALT: 23 U/L (ref 14–54)
AST: 24 U/L (ref 15–41)
BILIRUBIN DIRECT: 0.1 mg/dL (ref 0.1–0.5)
BILIRUBIN INDIRECT: 0.3 mg/dL (ref 0.3–0.9)
BILIRUBIN TOTAL: 0.4 mg/dL (ref 0.3–1.2)
Total Protein: 8.1 g/dL (ref 6.5–8.1)

## 2017-07-07 LAB — HEMOGLOBIN A1C
HEMOGLOBIN A1C: 5.6 % (ref 4.8–5.6)
Mean Plasma Glucose: 114.02 mg/dL

## 2017-07-07 LAB — MAGNESIUM: MAGNESIUM: 2.3 mg/dL (ref 1.7–2.4)

## 2017-07-07 MED ORDER — VITAMIN C 500 MG PO TABS
500.0000 mg | ORAL_TABLET | Freq: Every day | ORAL | Status: DC
  Administered 2017-07-07 – 2017-07-09 (×3): 500 mg via ORAL
  Filled 2017-07-07 (×5): qty 1

## 2017-07-07 MED ORDER — BUDESONIDE 0.5 MG/2ML IN SUSP
0.5000 mg | Freq: Two times a day (BID) | RESPIRATORY_TRACT | Status: DC
  Administered 2017-07-07 – 2017-07-09 (×4): 0.5 mg via RESPIRATORY_TRACT
  Filled 2017-07-07 (×7): qty 2

## 2017-07-07 MED ORDER — MAGNESIUM SULFATE 2 GM/50ML IV SOLN
2.0000 g | Freq: Once | INTRAVENOUS | Status: AC
  Administered 2017-07-07: 2 g via INTRAVENOUS
  Filled 2017-07-07: qty 50

## 2017-07-07 MED ORDER — DILTIAZEM HCL ER COATED BEADS 240 MG PO CP24
360.0000 mg | ORAL_CAPSULE | Freq: Every day | ORAL | Status: DC
  Administered 2017-07-07 – 2017-07-08 (×2): 360 mg via ORAL
  Filled 2017-07-07 (×8): qty 1

## 2017-07-07 MED ORDER — TRAZODONE HCL 50 MG PO TABS: 50.0000 mg | ORAL_TABLET | Freq: Every evening | ORAL | Status: DC | PRN

## 2017-07-07 MED ORDER — VITAMIN D 1000 UNITS PO TABS
1000.0000 [IU] | ORAL_TABLET | Freq: Every day | ORAL | Status: DC
  Administered 2017-07-07 – 2017-07-09 (×3): 1000 [IU] via ORAL
  Filled 2017-07-07 (×5): qty 1

## 2017-07-07 MED ORDER — ONDANSETRON HCL 4 MG/2ML IJ SOLN: 4.0000 mg | Freq: Four times a day (QID) | INTRAMUSCULAR | Status: DC | PRN

## 2017-07-07 MED ORDER — ALBUTEROL (5 MG/ML) CONTINUOUS INHALATION SOLN
20.0000 mg/h | INHALATION_SOLUTION | Freq: Once | RESPIRATORY_TRACT | Status: AC
  Administered 2017-07-07: 20 mg/h via RESPIRATORY_TRACT
  Filled 2017-07-07: qty 20

## 2017-07-07 MED ORDER — B COMPLEX-C PO TABS
1.0000 | ORAL_TABLET | Freq: Every day | ORAL | Status: DC
  Administered 2017-07-07 – 2017-07-09 (×3): 1 via ORAL
  Filled 2017-07-07 (×5): qty 1

## 2017-07-07 MED ORDER — ACETAMINOPHEN 650 MG RE SUPP: 650.0000 mg | Freq: Four times a day (QID) | RECTAL | Status: DC | PRN

## 2017-07-07 MED ORDER — ACETAMINOPHEN 325 MG PO TABS
650.0000 mg | ORAL_TABLET | Freq: Four times a day (QID) | ORAL | Status: DC | PRN
  Administered 2017-07-07 – 2017-07-08 (×5): 650 mg via ORAL
  Filled 2017-07-07 (×5): qty 2

## 2017-07-07 MED ORDER — IPRATROPIUM-ALBUTEROL 0.5-2.5 (3) MG/3ML IN SOLN
3.0000 mL | Freq: Four times a day (QID) | RESPIRATORY_TRACT | Status: DC
  Administered 2017-07-07 – 2017-07-09 (×9): 3 mL via RESPIRATORY_TRACT
  Filled 2017-07-07 (×9): qty 3

## 2017-07-07 MED ORDER — ENOXAPARIN SODIUM 40 MG/0.4ML ~~LOC~~ SOLN
40.0000 mg | SUBCUTANEOUS | Status: DC
  Administered 2017-07-07 – 2017-07-09 (×3): 40 mg via SUBCUTANEOUS
  Filled 2017-07-07 (×3): qty 0.4

## 2017-07-07 MED ORDER — PRAVASTATIN SODIUM 40 MG PO TABS
80.0000 mg | ORAL_TABLET | Freq: Every day | ORAL | Status: DC
  Administered 2017-07-07 – 2017-07-09 (×3): 80 mg via ORAL
  Filled 2017-07-07 (×5): qty 2

## 2017-07-07 MED ORDER — METHYLPREDNISOLONE SODIUM SUCC 125 MG IJ SOLR
60.0000 mg | Freq: Three times a day (TID) | INTRAMUSCULAR | Status: DC
  Administered 2017-07-07 – 2017-07-09 (×6): 60 mg via INTRAVENOUS
  Filled 2017-07-07 (×6): qty 2

## 2017-07-07 MED ORDER — ONDANSETRON HCL 4 MG PO TABS: 4.0000 mg | ORAL_TABLET | Freq: Four times a day (QID) | ORAL | Status: DC | PRN

## 2017-07-07 MED ORDER — PANTOPRAZOLE SODIUM 40 MG PO TBEC
40.0000 mg | DELAYED_RELEASE_TABLET | Freq: Every day | ORAL | Status: DC
  Administered 2017-07-07 – 2017-07-09 (×3): 40 mg via ORAL
  Filled 2017-07-07 (×3): qty 1

## 2017-07-07 MED ORDER — ALBUTEROL SULFATE (2.5 MG/3ML) 0.083% IN NEBU: 2.5000 mg | INHALATION_SOLUTION | RESPIRATORY_TRACT | Status: DC | PRN

## 2017-07-07 MED ORDER — METHYLPREDNISOLONE SODIUM SUCC 125 MG IJ SOLR
125.0000 mg | Freq: Once | INTRAMUSCULAR | Status: AC
  Administered 2017-07-07: 125 mg via INTRAVENOUS
  Filled 2017-07-07: qty 2

## 2017-07-07 MED ORDER — IPRATROPIUM BROMIDE 0.02 % IN SOLN
0.5000 mg | Freq: Once | RESPIRATORY_TRACT | Status: AC
  Administered 2017-07-07: 0.5 mg via RESPIRATORY_TRACT
  Filled 2017-07-07: qty 2.5

## 2017-07-07 MED ORDER — METHYLPHENIDATE HCL 5 MG PO TABS
10.0000 mg | ORAL_TABLET | Freq: Two times a day (BID) | ORAL | Status: DC
  Filled 2017-07-07: qty 2

## 2017-07-07 MED ORDER — SODIUM CHLORIDE 0.9 % IV SOLN
INTRAVENOUS | Status: AC
  Administered 2017-07-07 – 2017-07-08 (×2): via INTRAVENOUS
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
## 2017-07-07 NOTE — ED Triage Notes (Signed)
Pt with history of Asthma, states worsening of sob past 4-5 hours.  Pt denies pain, reports chest tightness. Pt speaking on one word answers only, audible wheezing.

## 2017-07-08 LAB — BASIC METABOLIC PANEL
Anion gap: 9 (ref 5–15)
BUN: 18 mg/dL (ref 6–20)
CHLORIDE: 107 mmol/L (ref 101–111)
CO2: 20 mmol/L — AB (ref 22–32)
CREATININE: 0.63 mg/dL (ref 0.44–1.00)
Calcium: 10 mg/dL (ref 8.9–10.3)
GFR calc Af Amer: >60 mL/min (ref >60)
GFR calc non Af Amer: >60 mL/min (ref >60)
GLUCOSE: 190 mg/dL — AB (ref 65–99)
Potassium: 3.5 mmol/L (ref 3.5–5.1)
Sodium: 136 mmol/L (ref 135–145)

## 2017-07-08 LAB — CBC
HCT: 42.1 % (ref 36.0–46.0)
Hemoglobin: 13.4 g/dL (ref 12.0–15.0)
MCH: 27.7 pg (ref 26.0–34.0)
MCHC: 31.8 g/dL (ref 30.0–36.0)
MCV: 87 fL (ref 78.0–100.0)
PLATELETS: 265 10*3/uL (ref 150–400)
RBC: 4.84 MIL/uL (ref 3.87–5.11)
RDW: 13.8 % (ref 11.5–15.5)
WBC: 16.8 10*3/uL — ABNORMAL HIGH (ref 4.0–10.5)

## 2017-07-08 LAB — HIV ANTIBODY (ROUTINE TESTING W REFLEX): HIV Screen 4th Generation wRfx: NONREACTIVE

## 2017-07-08 NOTE — Progress Notes (Signed)
SATURATION QUALIFICATIONS: (This note is used to comply with regulatory documentation for home oxygen)  Patient Saturations on Room Air at Rest = 94%  Patient Saturations on Room Air while Ambulating = 92%  

## 2017-07-08 NOTE — Care Management Important Message (Signed)
Important Message  Patient Details  Name: Shelley Hoffman MRN: [id number] Date of Birth: 1965-10-28   Medicare Important Message Given:  Yes    Malcolm Metrohildress, Mariane Burpee Demske, RN 07/08/2017, 10:40 AM

## 2017-07-08 NOTE — Care Management (Addendum)
Pt is from home, has insurance, PCP and transportation. Pt required oxygen acutely but has weaned.anditicpate DC home tomorrow. MD unsure if nebs will be ordered at DC. If pt discharges on nebs, she will need neb machine.

## 2017-07-08 NOTE — Progress Notes (Signed)
[facility] PROGRESS NOTE  Shelley Hoffman JXB:[id number] DOB: 10-Mar-1966 DOA: 07/07/2017 PCP: Gareth MorganKnowlton, Steve, MD  Interim summary and HPI 52 y/o with hx of HTN, HLD and asthma; who presented to ED with 24 hours complaints of SOB. Patient reported having slight difficulty to catch her breath initially; subsequently developed chest tightness sensation and could not catch her breath at all. Patient denies nausea, vomiting, fever, chills, cough, dysuria, abd pain or any other complaints.  Patient reported that her albuterol inhaler didn't cut off her symptoms as usually does. No sick contacts.   Assessment/Plan: 1-SOB due to Asthma, chronic, unspecified asthma severity, with acute exacerbation -patient breathing improving -continue weaning O2 supplementation to RA -continue steroids/start tapering -continue nebulizer treatment -will discharge on advair  -patient would benefit of follow up with pulmonary service as an outpatient for PFT's and to further adjust long term maintenance therapy of her asthma regimen.  2-Benign essential HTN -stable and well controlled -continue cardizem  3-HLD (hyperlipidemia) -will continue statins   4-GI prophylaxis while receiving steroids  -continue PPI  5-hyperglycemia -secondary to steroids use -A1C 5.6 -continue monitoring CBG  6-insomnia -continue PRN trazodone -resume melatonin at discharge   Code Status: full  Family Communication: no family at bedside  Disposition Plan: most likely home in am if continue improving her breathing. Will weaned O2 down to Ra as tolerated, increase activity. Continue steroids and nebulizer therapy.   Consultants:  None   Procedures:  See below for x-ray reports.   Antibiotics:  None   HPI/Subjective: Afebrile, no CP, no nausea, no vomiting. Patient wearing 1.5-2L of oxygen saturation, feeling better and almost completely speaking in full sentences.   Objective: Vitals:   07/08/17 1930  07/08/17 2242  BP:  135/76  Pulse:  83  Resp:  20  Temp:  98.6 F (37 C)  SpO2: 92% 91%   No intake or output data in the 24 hours ending 07/08/17 2244 Filed Weights   07/07/17 0949  Weight: 96.3 kg (212 lb 4.9 oz)    Exam:   General: afebrile, no CP and reporting breathing much better. Almost able to speak in full sentences.   Cardiovascular: S1 and S2, no rubs, no gallops  Respiratory: good air movement, bilaterally, no using accessory muscles, very little exp wheezing, no crackles.  Abdomen: soft, NT, ND, positive BS  Musculoskeletal: no edema, no cyanosis    Data Reviewed: Basic Metabolic Panel: Recent Labs  Lab 07/07/17 0559 07/08/17 0711  NA 137 136  K 3.6 3.5  CL 102 107  CO2 24 20*  GLUCOSE 135* 190*  BUN 13 18  CREATININE 0.59 0.63  CALCIUM 10.4* 10.0  MG 2.3  --    Liver Function Tests: Recent Labs  Lab 07/07/17 0559  AST 24  ALT 23  ALKPHOS 96  BILITOT 0.4  PROT 8.1  ALBUMIN 4.4   CBC: Recent Labs  Lab 07/07/17 0559 07/08/17 0711  WBC 11.0* 16.8*  NEUTROABS 8.8*  --   HGB 13.8 13.4  HCT 43.5 42.1  MCV 87.3 87.0  PLT 237 265    Studies: Dg Chest Port 1 View  Result Date: 07/07/2017 CLINICAL DATA:  Acute onset of shortness of breath. Generalized chest tightness. EXAM: PORTABLE CHEST 1 VIEW COMPARISON:  Chest radiograph performed 01/14/2009 FINDINGS: The lungs are well-aerated and clear. There is no evidence of focal opacification, pleural effusion or pneumothorax. The cardiomediastinal silhouette is within normal limits. No acute osseous abnormalities are seen. IMPRESSION: No acute cardiopulmonary  process seen. Electronically Signed   By: Roanna Raider M.D.   On: 07/07/2017 05:48    Scheduled Meds: . B-complex with vitamin C  1 tablet Oral Daily  . budesonide (PULMICORT) nebulizer solution  0.5 mg Nebulization BID  . cholecalciferol  1,000 Units Oral Daily  . diltiazem (CARDIZEM CD) 24 hr capsule 360 mg  360 mg Oral Daily  .  enoxaparin (LOVENOX) injection  40 mg Subcutaneous Q24H  . ipratropium-albuterol  3 mL Nebulization Q6H  . methylPREDNISolone (SOLU-MEDROL) injection  60 mg Intravenous Q8H  . pantoprazole  40 mg Oral Daily  . pravastatin  80 mg Oral Daily  . vitamin C  500 mg Oral Daily   Continuous Infusions:  Principal Problem:   Asthma, chronic, unspecified asthma severity, with acute exacerbation Active Problems:   Benign essential HTN   HLD (hyperlipidemia)   Hypoxia    Time spent: 30 minutes    Vassie Loll  [facility] Pager [phone] If 7PM-7AM, please contact night-coverage at [email or web address], password [facility] 07/08/2017, 10:44 PM  LOS: 1 day

## 2017-07-09 DIAGNOSIS — K219 Gastro-esophageal reflux disease without esophagitis: Secondary | ICD-10-CM

## 2017-07-09 DIAGNOSIS — E661 Drug-induced obesity: Secondary | ICD-10-CM

## 2017-07-09 DIAGNOSIS — Z6836 Body mass index (BMI) 36.0-36.9, adult: Secondary | ICD-10-CM

## 2017-07-09 MED ORDER — PANTOPRAZOLE SODIUM 40 MG PO TBEC: 40.0000 mg | DELAYED_RELEASE_TABLET | Freq: Every day | ORAL | 1 refills | Status: DC

## 2017-07-09 MED ORDER — PREDNISONE 20 MG PO TABS: ORAL_TABLET | ORAL | 0 refills | Status: DC

## 2017-07-09 MED ORDER — IPRATROPIUM-ALBUTEROL 20-100 MCG/ACT IN AERS: 1.0000 | INHALATION_SPRAY | Freq: Four times a day (QID) | RESPIRATORY_TRACT | 1 refills | Status: DC | PRN

## 2017-07-09 MED ORDER — FLUTICASONE-SALMETEROL 250-50 MCG/DOSE IN AEPB: 1.0000 | INHALATION_SPRAY | Freq: Two times a day (BID) | RESPIRATORY_TRACT | 1 refills | Status: DC

## 2017-07-09 NOTE — Progress Notes (Signed)
Pt discharged home today per Dr Madera. Pt's IV site D/C'd and WDL. Pt's VSS. Pt provided with home medication list, discharge instructions and prescriptions. Verbalized understanding. Pt left floor via WC in stable condition accompanied by NT. 

## 2017-07-09 NOTE — Discharge Summary (Addendum)
Physician Discharge Summary  Shelley Hoffman ZOX:[id number] DOB: 11/16/1965 DOA: 07/07/2017  PCP: Gareth Morgan, MD  Admit date: 07/07/2017 Discharge date: 07/09/2017  Time spent: 35 minutes  Recommendations for Outpatient Follow-up:  Repeat BMET to follow up electrolytes and renal function trend  Follow response to empiric regimen for asthma Arrange outpatient follow up with pulmonologist, for PFT's and further adjustment to long term regimen.  Discharge Diagnoses:  Principal Problem:   Moderate persistent asthma with exacerbation Active Problems:   Benign essential HTN   HLD (hyperlipidemia)   Hypoxia   Class 1 obesity without serious comorbidity with body mass index (BMI) Body mass index is 33.25 kg/m.   Gastroesophageal reflux disease   Discharge Condition: stable and improved. Discharge home with instructions to follow up with PCP in 10 days.  Diet recommendation: heart healthy and low calorie diet    Filed Weights   07/07/17 0949  Weight: 96.3 kg (212 lb 4.9 oz)    History of present illness:  52 y/o with hx of HTN, HLD and asthma; who presented to ED with 24 hours complaints of SOB. Patient reported having slight difficulty to catch her breath initially; subsequently developed chest tightness sensation and could not catch her breath at all. Patient denies nausea, vomiting, fever, chills, cough, dysuria, abd pain or any other complaints.  Patient reported that her albuterol inhaler didn't cut off her symptoms as usually does. No sick contacts.  Of note, patient reports experiencing likely increase wheezing and shortness of breath with activity and at rest; something that is different from her usual mild asthma years ago.  Hospital Course:  1-SOB due toAsthma, chronic, unspecified asthma severity, with acute exacerbation -patient breathing improved -good O2 sat on RA at discharge and  -continue steroids tapering -discharge PRN Combivent  -will discharge on advair   -patient would benefit of follow up with pulmonary service as an outpatient for PFT's and to further adjust long term maintenance therapy of her asthma regimen.  2-Benign essential HTN -stable and well controlled -continue cardizem  3-HLD (hyperlipidemia) -will continue statins   4-GI prophylaxis while receiving steroids  -continue PPI  5-hyperglycemia -secondary to steroids use -A1C 5.6 -advise to follow low carbohydrates and low calorie diet.  6-insomnia -resume melatonin at discharge  7-obesity class 1 -Body mass index is 33.25 kg/m. -low calorie diet and exercise discussed with patient   Procedures:  See below for x-ray reports.  Consultations:  None  Discharge Exam: Vitals:   07/09/17 0530 07/09/17 0744  BP: (!) 155/87   Pulse: 74   Resp: 17   Temp: 98 F (36.7 C)   SpO2: 97% 96%     General: afebrile, no CP and reporting breathing is significantly much better.  Able to speak in full sentences.  No requiring oxygen supplementation.     Cardiovascular: S1 and S2, no rubs, no gallops  Respiratory: good air movement, bilaterally, no using accessory muscles, scattered rhonchi, no expiratory wheezings on exam.  No crackles.    Abdomen: soft, NT, ND, positive BS  Musculoskeletal: no edema, no cyanosis     Discharge Instructions   Discharge Instructions    Diet - low sodium heart healthy   Complete by:  As directed    Discharge instructions   Complete by:  As directed    Take medications as prescribed. Keep yourself well-hydrated. Follow heart healthy/low calorie diet. Arrange follow-up with PCP in 10 days.     Allergies as of 07/09/2017  Reactions   Latex Rash      Medication List    TAKE these medications   ALIVE WOMENS 50+ Tabs Take 1 tablet by mouth daily.   b complex vitamins tablet Take 1 tablet by mouth daily.   cetirizine 10 MG tablet Commonly known as:  ZYRTEC Take 10 mg by mouth daily.   diltiazem 360 MG 24 hr  capsule Commonly known as:  TIAZAC Take 360 mg by mouth daily.   Fluticasone-Salmeterol 250-50 MCG/DOSE Aepb Commonly known as:  ADVAIR DISKUS Inhale 1 puff into the lungs 2 (two) times daily.   HYDROcodone-acetaminophen 5-325 MG tablet Commonly known as:  NORCO/VICODIN Take 1 tablet by mouth at bedtime as needed for pain.   Ipratropium-Albuterol 20-100 MCG/ACT Aers respimat Commonly known as:  COMBIVENT Inhale 1 puff into the lungs every 6 (six) hours as needed for wheezing or shortness of breath.   Melatonin 3 MG Tabs Take 3 mg by mouth daily.   pantoprazole 40 MG tablet Commonly known as:  PROTONIX Take 1 tablet (40 mg total) by mouth daily. Start taking on:  07/10/2017   pravastatin 80 MG tablet Commonly known as:  PRAVACHOL Take 80 mg by mouth daily.   predniSONE 20 MG tablet Commonly known as:  DELTASONE Take 3 tablets by mouth daily times 1 day; then 2 tablets by mouth daily times 2 days; then 1 tablet by mouth daily times 2 days; then 1/2 tablet by mouth daily times 3 days and stop prednisone.   silver sulfADIAZINE 1 % cream Commonly known as:  SILVADENE Apply 1 application topically daily. What changed:    when to take this  reasons to take this      Allergies  Allergen Reactions  . Latex Rash   Follow-up Information    Gareth Morgan, MD. Schedule an appointment as soon as possible for a visit in 10 day(s).   Specialty:  Family Medicine Contact information: [address] [place] [address] [phone]           The results of significant diagnostics from this hospitalization (including imaging, microbiology, ancillary and laboratory) are listed below for reference.    Significant Diagnostic Studies: Dg Chest Port 1 View  Result Date: 07/07/2017 CLINICAL DATA:  Acute onset of shortness of breath. Generalized chest tightness. EXAM: PORTABLE CHEST 1 VIEW COMPARISON:  Chest radiograph performed 01/14/2009 FINDINGS: The lungs are  well-aerated and clear. There is no evidence of focal opacification, pleural effusion or pneumothorax. The cardiomediastinal silhouette is within normal limits. No acute osseous abnormalities are seen. IMPRESSION: No acute cardiopulmonary process seen. Electronically Signed   By: Roanna Raider M.D.   On: 07/07/2017 05:48   Microbiology: No results found for this or any previous visit (from the past 240 hour(s)).   Labs: Basic Metabolic Panel: Recent Labs  Lab 07/07/17 0559 07/08/17 0711  NA 137 136  K 3.6 3.5  CL 102 107  CO2 24 20*  GLUCOSE 135* 190*  BUN 13 18  CREATININE 0.59 0.63  CALCIUM 10.4* 10.0  MG 2.3  --    Liver Function Tests: Recent Labs  Lab 07/07/17 0559  AST 24  ALT 23  ALKPHOS 96  BILITOT 0.4  PROT 8.1  ALBUMIN 4.4   CBC: Recent Labs  Lab 07/07/17 0559 07/08/17 0711  WBC 11.0* 16.8*  NEUTROABS 8.8*  --   HGB 13.8 13.4  HCT 43.5 42.1  MCV 87.3 87.0  PLT 237 265    Signed:  Mikle Bosworth  Gwenlyn PerkingMadera MD.  [facility] 07/09/2017, 2:05 PM

## 2017-08-22 ENCOUNTER — Other Ambulatory Visit: Payer: Self-pay

## 2017-08-24 LAB — CYTOLOGY - PAP: DIAGNOSIS: NEGATIVE

## 2017-08-31 DIAGNOSIS — R69 Illness, unspecified: Secondary | ICD-10-CM | POA: Diagnosis not present

## 2017-08-31 DIAGNOSIS — J4530 Mild persistent asthma, uncomplicated: Secondary | ICD-10-CM | POA: Diagnosis not present

## 2017-08-31 DIAGNOSIS — Z79891 Long term (current) use of opiate analgesic: Secondary | ICD-10-CM | POA: Diagnosis not present

## 2017-08-31 DIAGNOSIS — I10 Essential (primary) hypertension: Secondary | ICD-10-CM | POA: Diagnosis not present

## 2017-09-01 ENCOUNTER — Encounter: Payer: Self-pay | Admitting: Gastroenterology

## 2017-09-13 DIAGNOSIS — Z79891 Long term (current) use of opiate analgesic: Secondary | ICD-10-CM | POA: Diagnosis not present

## 2017-09-26 ENCOUNTER — Ambulatory Visit: Payer: Medicare HMO

## 2017-09-26 ENCOUNTER — Encounter: Payer: Self-pay | Admitting: Gastroenterology

## 2017-09-26 ENCOUNTER — Telehealth: Payer: Self-pay

## 2017-09-26 NOTE — Telephone Encounter (Signed)
noted 

## 2017-09-26 NOTE — Telephone Encounter (Signed)
PATIENT WAS A NO SHOW AND LETTER SENT  °

## 2017-10-25 DIAGNOSIS — J301 Allergic rhinitis due to pollen: Secondary | ICD-10-CM | POA: Diagnosis not present

## 2017-10-25 DIAGNOSIS — I10 Essential (primary) hypertension: Secondary | ICD-10-CM | POA: Diagnosis not present

## 2017-10-25 DIAGNOSIS — J45909 Unspecified asthma, uncomplicated: Secondary | ICD-10-CM | POA: Diagnosis not present

## 2017-10-25 DIAGNOSIS — Z87820 Personal history of traumatic brain injury: Secondary | ICD-10-CM | POA: Diagnosis not present

## 2017-11-28 DIAGNOSIS — R69 Illness, unspecified: Secondary | ICD-10-CM | POA: Diagnosis not present

## 2017-11-28 DIAGNOSIS — I10 Essential (primary) hypertension: Secondary | ICD-10-CM | POA: Diagnosis not present

## 2017-11-28 DIAGNOSIS — Z124 Encounter for screening for malignant neoplasm of cervix: Secondary | ICD-10-CM | POA: Diagnosis not present

## 2017-11-28 DIAGNOSIS — Z008 Encounter for other general examination: Secondary | ICD-10-CM | POA: Diagnosis not present

## 2018-01-25 DIAGNOSIS — J45909 Unspecified asthma, uncomplicated: Secondary | ICD-10-CM | POA: Diagnosis not present

## 2018-01-25 DIAGNOSIS — Z87820 Personal history of traumatic brain injury: Secondary | ICD-10-CM | POA: Diagnosis not present

## 2018-01-25 DIAGNOSIS — J301 Allergic rhinitis due to pollen: Secondary | ICD-10-CM | POA: Diagnosis not present

## 2018-01-25 DIAGNOSIS — I10 Essential (primary) hypertension: Secondary | ICD-10-CM | POA: Diagnosis not present

## 2018-04-26 DIAGNOSIS — R69 Illness, unspecified: Secondary | ICD-10-CM | POA: Diagnosis not present

## 2018-05-10 DIAGNOSIS — I10 Essential (primary) hypertension: Secondary | ICD-10-CM | POA: Diagnosis not present

## 2018-05-10 DIAGNOSIS — R69 Illness, unspecified: Secondary | ICD-10-CM | POA: Diagnosis not present

## 2018-05-10 DIAGNOSIS — Z23 Encounter for immunization: Secondary | ICD-10-CM | POA: Diagnosis not present

## 2018-05-10 DIAGNOSIS — S60351A Superficial foreign body of right thumb, initial encounter: Secondary | ICD-10-CM | POA: Diagnosis not present

## 2018-06-02 DIAGNOSIS — Z79891 Long term (current) use of opiate analgesic: Secondary | ICD-10-CM | POA: Diagnosis not present

## 2018-06-02 DIAGNOSIS — E6609 Other obesity due to excess calories: Secondary | ICD-10-CM | POA: Diagnosis not present

## 2018-06-02 DIAGNOSIS — I10 Essential (primary) hypertension: Secondary | ICD-10-CM | POA: Diagnosis not present

## 2018-06-02 DIAGNOSIS — E7800 Pure hypercholesterolemia, unspecified: Secondary | ICD-10-CM | POA: Diagnosis not present

## 2018-09-06 DIAGNOSIS — J301 Allergic rhinitis due to pollen: Secondary | ICD-10-CM | POA: Diagnosis not present

## 2018-09-06 DIAGNOSIS — J45909 Unspecified asthma, uncomplicated: Secondary | ICD-10-CM | POA: Diagnosis not present

## 2018-10-30 DIAGNOSIS — Z1231 Encounter for screening mammogram for malignant neoplasm of breast: Secondary | ICD-10-CM | POA: Diagnosis not present

## 2019-06-29 DIAGNOSIS — Z7189 Other specified counseling: Secondary | ICD-10-CM | POA: Diagnosis not present

## 2019-06-29 DIAGNOSIS — E7800 Pure hypercholesterolemia, unspecified: Secondary | ICD-10-CM | POA: Diagnosis not present

## 2019-06-29 DIAGNOSIS — J4520 Mild intermittent asthma, uncomplicated: Secondary | ICD-10-CM | POA: Diagnosis not present

## 2019-06-29 DIAGNOSIS — I10 Essential (primary) hypertension: Secondary | ICD-10-CM | POA: Diagnosis not present

## 2019-10-23 DIAGNOSIS — I10 Essential (primary) hypertension: Secondary | ICD-10-CM | POA: Diagnosis not present

## 2019-10-23 DIAGNOSIS — Z1321 Encounter for screening for nutritional disorder: Secondary | ICD-10-CM | POA: Diagnosis not present

## 2019-10-23 DIAGNOSIS — E7800 Pure hypercholesterolemia, unspecified: Secondary | ICD-10-CM | POA: Diagnosis not present

## 2019-10-23 DIAGNOSIS — Z79891 Long term (current) use of opiate analgesic: Secondary | ICD-10-CM | POA: Diagnosis not present

## 2019-10-31 DIAGNOSIS — Z1231 Encounter for screening mammogram for malignant neoplasm of breast: Secondary | ICD-10-CM | POA: Diagnosis not present

## 2020-08-20 ENCOUNTER — Ambulatory Visit
Admission: EM | Admit: 2020-08-20 | Discharge: 2020-08-20 | Disposition: A | Discharge status: Home / Self Care | Payer: Medicare HMO | Attending: Emergency Medicine | Admitting: Emergency Medicine

## 2020-08-20 ENCOUNTER — Other Ambulatory Visit: Payer: Self-pay

## 2020-08-20 DIAGNOSIS — H60391 Other infective otitis externa, right ear: Secondary | ICD-10-CM

## 2020-08-20 DIAGNOSIS — H9201 Otalgia, right ear: Secondary | ICD-10-CM | POA: Diagnosis not present

## 2020-08-20 DIAGNOSIS — H66001 Acute suppurative otitis media without spontaneous rupture of ear drum, right ear: Secondary | ICD-10-CM

## 2020-08-20 MED ORDER — AZITHROMYCIN 250 MG PO TABS: 250.0000 mg | ORAL_TABLET | Freq: Every day | ORAL | 0 refills | Status: DC

## 2020-08-20 MED ORDER — CIPROFLOXACIN-DEXAMETHASONE 0.3-0.1 % OT SUSP: 4.0000 [drp] | Freq: Two times a day (BID) | OTIC | 0 refills | Status: AC
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
## 2020-08-20 NOTE — ED Triage Notes (Signed)
Pt presents with c/o right ear pain for past week

## 2020-08-20 NOTE — Discharge Instructions (Addendum)
Rest and drink plenty of fluids Prescribed z-pak per patient request Prescribed ciprodex ear drops Take medications as directed and to completion Continue to use OTC ibuprofen and/ or tylenol as needed for pain control Follow up with PCP if symptoms persists Return here or go to the ER if you have any new or worsening symptoms fever, chills, nausea, vomiting, drainage, hearing changes, worsening symptoms despite medications, etc..Marland Kitchen

## 2021-06-04 ENCOUNTER — Encounter: Payer: Self-pay | Admitting: Emergency Medicine

## 2021-06-04 ENCOUNTER — Other Ambulatory Visit: Payer: Self-pay

## 2021-06-04 ENCOUNTER — Ambulatory Visit
Admission: EM | Admit: 2021-06-04 | Discharge: 2021-06-04 | Disposition: A | Discharge status: Home / Self Care | Payer: Medicare HMO

## 2021-06-04 DIAGNOSIS — L03213 Periorbital cellulitis: Secondary | ICD-10-CM

## 2021-06-04 MED ORDER — ERYTHROMYCIN 5 MG/GM OP OINT: TOPICAL_OINTMENT | Freq: Two times a day (BID) | OPHTHALMIC | 0 refills | Status: AC

## 2021-06-04 MED ORDER — SULFAMETHOXAZOLE-TRIMETHOPRIM 800-160 MG PO TABS: 1.0000 | ORAL_TABLET | Freq: Two times a day (BID) | ORAL | 0 refills | Status: AC

## 2021-06-04 NOTE — ED Triage Notes (Signed)
Pt presents with right eye redness, pain and drainage x 2 weeks.

## 2021-06-04 NOTE — Discharge Instructions (Addendum)
Warm compress over the right upper eyelid If you notice worsening redness, pain, swelling, discharge or pain with looking around in different directions please return to urgent care to be reevaluated. Take medications as prescribed
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
## 2021-07-16 ENCOUNTER — Emergency Department (HOSPITAL_COMMUNITY)
Admission: EM | Admit: 2021-07-16 | Discharge: 2021-07-16 | Disposition: A | Discharge status: Home / Self Care | Payer: Medicare HMO | Attending: Emergency Medicine | Admitting: Emergency Medicine

## 2021-07-16 ENCOUNTER — Other Ambulatory Visit: Payer: Self-pay

## 2021-07-16 ENCOUNTER — Encounter (HOSPITAL_COMMUNITY): Payer: Self-pay | Admitting: Emergency Medicine

## 2021-07-16 DIAGNOSIS — Z743 Need for continuous supervision: Secondary | ICD-10-CM | POA: Diagnosis not present

## 2021-07-16 DIAGNOSIS — M542 Cervicalgia: Secondary | ICD-10-CM | POA: Diagnosis not present

## 2021-07-16 DIAGNOSIS — S199XXA Unspecified injury of neck, initial encounter: Secondary | ICD-10-CM | POA: Diagnosis present

## 2021-07-16 DIAGNOSIS — R6889 Other general symptoms and signs: Secondary | ICD-10-CM | POA: Diagnosis not present

## 2021-07-16 DIAGNOSIS — Z91040 Latex allergy status: Secondary | ICD-10-CM | POA: Diagnosis not present

## 2021-07-16 DIAGNOSIS — S161XXA Strain of muscle, fascia and tendon at neck level, initial encounter: Secondary | ICD-10-CM | POA: Insufficient documentation

## 2021-07-16 DIAGNOSIS — Y92410 Unspecified street and highway as the place of occurrence of the external cause: Secondary | ICD-10-CM | POA: Insufficient documentation

## 2021-07-16 MED ORDER — ACETAMINOPHEN 325 MG PO TABS
650.0000 mg | ORAL_TABLET | Freq: Once | ORAL | Status: AC
  Administered 2021-07-16: 650 mg via ORAL
  Filled 2021-07-16: qty 2
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
## 2021-07-16 NOTE — ED Notes (Signed)
Pt ambulatory to waiting room. Pt verbalized understanding of discharge instructions.   

## 2021-07-16 NOTE — Discharge Instructions (Signed)
Tylenol every 4 hours as needed

## 2021-07-31 ENCOUNTER — Other Ambulatory Visit: Payer: Self-pay

## 2021-07-31 ENCOUNTER — Ambulatory Visit
Admission: EM | Admit: 2021-07-31 | Discharge: 2021-07-31 | Disposition: A | Discharge status: Home / Self Care | Payer: Medicare HMO

## 2021-07-31 DIAGNOSIS — M5441 Lumbago with sciatica, right side: Secondary | ICD-10-CM

## 2021-07-31 MED ORDER — TIZANIDINE HCL 4 MG PO TABS: 4.0000 mg | ORAL_TABLET | Freq: Every day | ORAL | 0 refills | Status: DC

## 2021-07-31 MED ORDER — PREDNISONE 20 MG PO TABS: ORAL_TABLET | ORAL | 0 refills | Status: DC

## 2021-07-31 MED ORDER — METHYLPREDNISOLONE SODIUM SUCC 125 MG IJ SOLR
125.0000 mg | Freq: Once | INTRAMUSCULAR | Status: AC
  Administered 2021-07-31: 125 mg via INTRAMUSCULAR
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
## 2021-09-08 ENCOUNTER — Other Ambulatory Visit: Payer: Self-pay

## 2021-09-08 ENCOUNTER — Ambulatory Visit
Admission: EM | Admit: 2021-09-08 | Discharge: 2021-09-08 | Disposition: A | Discharge status: Home / Self Care | Payer: Medicare HMO | Attending: Urgent Care | Admitting: Urgent Care

## 2021-09-08 DIAGNOSIS — R079 Chest pain, unspecified: Secondary | ICD-10-CM | POA: Diagnosis not present

## 2021-09-08 DIAGNOSIS — I10 Essential (primary) hypertension: Secondary | ICD-10-CM | POA: Diagnosis not present

## 2021-09-08 DIAGNOSIS — R0789 Other chest pain: Secondary | ICD-10-CM

## 2021-09-08 MED ORDER — TIZANIDINE HCL 4 MG PO TABS: 4.0000 mg | ORAL_TABLET | Freq: Every day | ORAL | 0 refills | Status: DC

## 2021-09-08 MED ORDER — ACETAMINOPHEN 325 MG PO TABS: 650.0000 mg | ORAL_TABLET | Freq: Four times a day (QID) | ORAL | 0 refills | Status: DC | PRN

## 2021-09-08 MED ORDER — AMLODIPINE BESYLATE 5 MG PO TABS: 5.0000 mg | ORAL_TABLET | Freq: Every day | ORAL | 0 refills | Status: DC

## 2021-09-08 NOTE — Discharge Instructions (Addendum)
We will manage your symptoms for chest wall pain. However, there is a possibility that your symptoms may change or worsen as some emergencies develop over time. If your chest pain persists and you have sweating, belly pain, shortness of breath, nausea, vomiting, then call 911 as it may be a life threatening event such as a heart attack and needs immediate medical attention. ?
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
## 2021-10-09 ENCOUNTER — Other Ambulatory Visit: Payer: Self-pay

## 2021-10-09 ENCOUNTER — Ambulatory Visit (INDEPENDENT_AMBULATORY_CARE_PROVIDER_SITE_OTHER): Payer: Medicare HMO

## 2021-10-09 ENCOUNTER — Ambulatory Visit
Admission: EM | Admit: 2021-10-09 | Discharge: 2021-10-09 | Disposition: A | Discharge status: Home / Self Care | Payer: Medicare HMO | Attending: Emergency Medicine | Admitting: Emergency Medicine

## 2021-10-09 ENCOUNTER — Encounter: Payer: Self-pay | Admitting: Emergency Medicine

## 2021-10-09 DIAGNOSIS — M545 Low back pain, unspecified: Secondary | ICD-10-CM

## 2021-10-09 DIAGNOSIS — M5442 Lumbago with sciatica, left side: Secondary | ICD-10-CM

## 2021-10-09 MED ORDER — METHYLPREDNISOLONE 4 MG PO TBPK: ORAL_TABLET | Freq: Every day | ORAL | 0 refills | Status: DC

## 2021-10-09 MED ORDER — METAXALONE 800 MG PO TABS: 800.0000 mg | ORAL_TABLET | Freq: Three times a day (TID) | ORAL | 0 refills | Status: DC

## 2021-10-09 MED ORDER — NAPROXEN 500 MG PO TABS: 500.0000 mg | ORAL_TABLET | Freq: Two times a day (BID) | ORAL | 0 refills | Status: AC
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
## 2021-10-09 NOTE — Discharge Instructions (Addendum)
Warm compresses, Epsom salt soaks, gentle stretching.  Take the Aleve with 1000 mg of Tylenol twice a day.  Steroids will be very helpful.  Try the Skelaxin.  If it is too expensive, you can continue the Zanaflex.  It is a good muscle relaxant.  Go to the ER for the signs and symptoms we discussed ?

## 2021-10-12 ENCOUNTER — Ambulatory Visit: Payer: Self-pay | Admitting: Family Medicine

## 2021-10-23 DIAGNOSIS — G4700 Insomnia, unspecified: Secondary | ICD-10-CM | POA: Diagnosis not present

## 2021-10-23 DIAGNOSIS — M6281 Muscle weakness (generalized): Secondary | ICD-10-CM | POA: Diagnosis not present

## 2021-10-23 DIAGNOSIS — Z87820 Personal history of traumatic brain injury: Secondary | ICD-10-CM | POA: Diagnosis not present

## 2021-10-23 DIAGNOSIS — I10 Essential (primary) hypertension: Secondary | ICD-10-CM | POA: Diagnosis not present

## 2021-10-30 DIAGNOSIS — Z0001 Encounter for general adult medical examination with abnormal findings: Secondary | ICD-10-CM | POA: Diagnosis not present

## 2021-10-30 DIAGNOSIS — E785 Hyperlipidemia, unspecified: Secondary | ICD-10-CM | POA: Diagnosis not present

## 2021-10-30 DIAGNOSIS — Z1331 Encounter for screening for depression: Secondary | ICD-10-CM | POA: Diagnosis not present

## 2021-10-30 DIAGNOSIS — I10 Essential (primary) hypertension: Secondary | ICD-10-CM | POA: Diagnosis not present

## 2021-10-30 DIAGNOSIS — Z1389 Encounter for screening for other disorder: Secondary | ICD-10-CM | POA: Diagnosis not present

## 2021-10-30 DIAGNOSIS — Z87820 Personal history of traumatic brain injury: Secondary | ICD-10-CM | POA: Diagnosis not present

## 2021-11-10 ENCOUNTER — Encounter: Payer: Self-pay | Admitting: Family Medicine

## 2021-11-10 ENCOUNTER — Ambulatory Visit (INDEPENDENT_AMBULATORY_CARE_PROVIDER_SITE_OTHER): Payer: Medicare HMO | Admitting: Family Medicine

## 2021-11-10 VITALS — BP 130/84 | HR 84 | Temp 97.7°F | Ht 65.0 in | Wt 223.6 lb

## 2021-11-10 DIAGNOSIS — Z130 Encounter for screening for diseases of the blood and blood-forming organs and certain disorders involving the immune mechanism: Secondary | ICD-10-CM

## 2021-11-10 DIAGNOSIS — J45909 Unspecified asthma, uncomplicated: Secondary | ICD-10-CM | POA: Insufficient documentation

## 2021-11-10 DIAGNOSIS — E669 Obesity, unspecified: Secondary | ICD-10-CM | POA: Insufficient documentation

## 2021-11-10 DIAGNOSIS — E1065 Type 1 diabetes mellitus with hyperglycemia: Secondary | ICD-10-CM | POA: Diagnosis not present

## 2021-11-10 DIAGNOSIS — E785 Hyperlipidemia, unspecified: Secondary | ICD-10-CM | POA: Diagnosis not present

## 2021-11-10 DIAGNOSIS — J4540 Moderate persistent asthma, uncomplicated: Secondary | ICD-10-CM | POA: Diagnosis not present

## 2021-11-10 DIAGNOSIS — I10 Essential (primary) hypertension: Secondary | ICD-10-CM

## 2021-11-10 DIAGNOSIS — S069XAA Unspecified intracranial injury with loss of consciousness status unknown, initial encounter: Secondary | ICD-10-CM | POA: Insufficient documentation

## 2021-11-10 MED ORDER — AMLODIPINE BESYLATE 5 MG PO TABS: 5.0000 mg | ORAL_TABLET | Freq: Every day | ORAL | 3 refills | Status: DC

## 2021-11-10 MED ORDER — BUDESONIDE-FORMOTEROL FUMARATE 160-4.5 MCG/ACT IN AERO: 2.0000 | INHALATION_SPRAY | Freq: Two times a day (BID) | RESPIRATORY_TRACT | 3 refills | Status: AC

## 2021-11-10 MED ORDER — ALBUTEROL SULFATE HFA 108 (90 BASE) MCG/ACT IN AERS: 2.0000 | INHALATION_SPRAY | Freq: Four times a day (QID) | RESPIRATORY_TRACT | 1 refills | Status: DC | PRN

## 2021-11-10 NOTE — Assessment & Plan Note (Signed)
Stable.  Continue amlodipine.  Labs today.

## 2021-11-10 NOTE — Patient Instructions (Signed)
Labs today.  I have refilled your medications.  Please get me the information on the most recent Cologuard (stool test).  Follow up in 6 months.  Take care  Dr. Adriana Simas

## 2021-11-10 NOTE — Progress Notes (Signed)
Subjective:  Patient ID: Shelley Hoffman, female    DOB: 10-11-1965  Age: 56 y.o. MRN: [id number]  CC: Chief Complaint  Patient presents with   Establish Care    Former patient of [place].     HPI:  56 year old female with obesity, history of TBI, hyperlipidemia, asthma, hypertension presents to establish care.  Patient is overdue for multiple health maintenance items.  Needs Pap smear.  Unsure of last mammogram.  Recommended patient to get her mammogram.  Patient states that she has done Cologuard testing in the past but is unsure of the timeframe.  Advised to bring [place] records of this.  Patient declines vaccines today.  Patient's asthma is treated with Symbicort and albuterol.  Her albuterol is expired.  She uses Symbicort frequently but not daily.  Patient's blood pressure is well controlled on amlodipine.  Patient is in need of labs.  There are no recent labs available.   Patient Active Problem List   Diagnosis Date Noted   Obesity (BMI 30-39.9) 11/10/2021   TBI (traumatic brain injury) ([place]) 11/10/2021   Asthma 11/10/2021   Benign essential HTN 07/07/2017   HLD (hyperlipidemia) 07/07/2017    Social Hx   Social History   Socioeconomic History   Marital status: Single    Spouse name: Not on file   Number of children: Not on file   Years of education: Not on file   Highest education level: Not on file  Occupational History   Not on file  Tobacco Use   Smoking status: Former   Smokeless tobacco: Never  Vaping Use   Vaping Use: Never used  Substance and Sexual Activity   Alcohol use: No   Drug use: No   Sexual activity: Not on file  Other Topics Concern   Not on file  Social History Narrative   Not on file   Social Determinants of Health   Financial Resource Strain: Not on file  Food Insecurity: Not on file  Transportation Needs: Not on file  Physical Activity: Not on file  Stress: Not on file  Social Connections: Not on file    Review of Systems   Respiratory: Negative.    Cardiovascular: Negative.    Objective:  BP 130/84   Pulse 84   Temp 97.7 F (36.5 C)   Ht $R'5\' 5"'vo$  (1.651 m)   Wt 223 lb 9.6 oz (101.4 kg)   LMP 05/22/2013   SpO2 98%   BMI 37.21 kg/m      11/10/2021   10:40 AM 10/09/2021    4:34 PM 10/09/2021    4:33 PM  BP/Weight  Systolic BP 628  315  Diastolic BP 84  91  Wt. (Lbs) 223.6 220   BMI 37.21 kg/m2 34.46 kg/m2     Physical Exam Vitals and nursing note reviewed.  Constitutional:      General: She is not in acute distress.    Appearance: She is obese.  HENT:     Head: Normocephalic and atraumatic.     Nose: Nose normal.  Eyes:     General:        Right eye: No discharge.        Left eye: No discharge.     Conjunctiva/sclera: Conjunctivae normal.  Cardiovascular:     Rate and Rhythm: Normal rate and regular rhythm.  Pulmonary:     Effort: Pulmonary effort is normal.     Breath sounds: Normal breath sounds. No wheezing, rhonchi or rales.  Abdominal:  General: There is no distension.     Palpations: Abdomen is soft.     Tenderness: There is no abdominal tenderness.  Neurological:     Mental Status: She is alert.  Psychiatric:     Comments: Flat affect     Lab Results  Component Value Date   WBC 16.8 (H) 07/08/2017   HGB 13.4 07/08/2017   HCT 42.1 07/08/2017   PLT 265 07/08/2017   GLUCOSE 190 (H) 07/08/2017   ALT 23 07/07/2017   AST 24 07/07/2017   NA 136 07/08/2017   K 3.5 07/08/2017   CL 107 07/08/2017   CREATININE 0.63 07/08/2017   BUN 18 07/08/2017   CO2 20 (L) 07/08/2017   HGBA1C 5.6 07/07/2017     Assessment & Plan:   Problem List Items Addressed This Visit       Cardiovascular and Mediastinum   Benign essential HTN - Primary    Stable.  Continue amlodipine.  Labs today.      Relevant Medications   amLODipine (NORVASC) 5 MG tablet   Other Relevant Orders   CMP14+EGFR     Respiratory   Asthma    Stable.  Symbicort and albuterol refilled.      Relevant  Medications   albuterol (VENTOLIN HFA) 108 (90 Base) MCG/ACT inhaler   budesonide-formoterol (SYMBICORT) 160-4.5 MCG/ACT inhaler     Other   HLD (hyperlipidemia) (Chronic)    Unsure of control.  No pharmacotherapy at this time.  Awaiting lab results.      Relevant Medications   amLODipine (NORVASC) 5 MG tablet   Other Relevant Orders   Lipid panel   Obesity (BMI 30-39.9)   Relevant Orders   Hemoglobin A1c   Other Visit Diagnoses     Screening for deficiency anemia       Relevant Orders   CBC       Meds ordered this encounter  Medications   amLODipine (NORVASC) 5 MG tablet    Sig: Take 1 tablet (5 mg total) by mouth daily.    Dispense:  90 tablet    Refill:  3   albuterol (VENTOLIN HFA) 108 (90 Base) MCG/ACT inhaler    Sig: Inhale 2 puffs into the lungs every 6 (six) hours as needed for wheezing or shortness of breath.    Dispense:  18 g    Refill:  1   budesonide-formoterol (SYMBICORT) 160-4.5 MCG/ACT inhaler    Sig: Inhale 2 puffs into the lungs 2 (two) times daily.    Dispense:  1 each    Refill:  3   Follow-up: 6 months.  [place]

## 2021-11-10 NOTE — Assessment & Plan Note (Signed)
Unsure of control.  No pharmacotherapy at this time.  Awaiting lab results.

## 2021-11-10 NOTE — Assessment & Plan Note (Signed)
Stable.  Symbicort and albuterol refilled.

## 2021-11-11 LAB — CMP14+EGFR
ALT: 26 IU/L (ref 0–32)
AST: 19 IU/L (ref 0–40)
Albumin/Globulin Ratio: 1.8 (ref 1.2–2.2)
Albumin: 4.2 g/dL (ref 3.8–4.9)
Alkaline Phosphatase: 116 IU/L (ref 44–121)
BUN/Creatinine Ratio: 18 (ref 9–23)
BUN: 15 mg/dL (ref 6–24)
Bilirubin Total: 0.2 mg/dL (ref 0.0–1.2)
CO2: 27 mmol/L (ref 20–29)
Calcium: 10.6 mg/dL — ABNORMAL HIGH (ref 8.7–10.2)
Chloride: 106 mmol/L (ref 96–106)
Creatinine, Ser: 0.83 mg/dL (ref 0.57–1.00)
Globulin, Total: 2.4 g/dL (ref 1.5–4.5)
Glucose: 79 mg/dL (ref 70–99)
Potassium: 4.4 mmol/L (ref 3.5–5.2)
Sodium: 145 mmol/L — ABNORMAL HIGH (ref 134–144)
Total Protein: 6.6 g/dL (ref 6.0–8.5)
eGFR: 84 mL/min/{1.73_m2} (ref >59)

## 2021-11-11 LAB — HEMOGLOBIN A1C
Est. average glucose Bld gHb Est-mCnc: 111 mg/dL
Hgb A1c MFr Bld: 5.5 % (ref 4.8–5.6)

## 2021-11-11 LAB — LIPID PANEL
Chol/HDL Ratio: 3.1 ratio (ref 0.0–4.4)
Cholesterol, Total: 266 mg/dL — ABNORMAL HIGH (ref 100–199)
HDL: 86 mg/dL (ref >39)
LDL Chol Calc (NIH): 160 mg/dL — ABNORMAL HIGH (ref 0–99)
Triglycerides: 115 mg/dL (ref 0–149)
VLDL Cholesterol Cal: 20 mg/dL (ref 5–40)

## 2021-11-11 LAB — CBC
Hematocrit: 38 % (ref 34.0–46.6)
Hemoglobin: 12.5 g/dL (ref 11.1–15.9)
MCH: 27.7 pg (ref 26.6–33.0)
MCHC: 32.9 g/dL (ref 31.5–35.7)
MCV: 84 fL (ref 79–97)
Platelets: 312 10*3/uL (ref 150–450)
RBC: 4.52 x10E6/uL (ref 3.77–5.28)
RDW: 12.5 % (ref 11.7–15.4)
WBC: 6.8 10*3/uL (ref 3.4–10.8)

## 2022-02-18 ENCOUNTER — Telehealth: Payer: Self-pay | Admitting: Family Medicine

## 2022-02-18 MED ORDER — CETIRIZINE HCL 10 MG PO TABS: 10.0000 mg | ORAL_TABLET | Freq: Every day | ORAL | 11 refills | Status: DC

## 2022-02-18 NOTE — Telephone Encounter (Signed)
Med sent to pharmacy. Left message to return call  

## 2022-02-18 NOTE — Telephone Encounter (Signed)
Patient is requesting prescription for cetirizine 10 mg be sent to [facility]

## 2022-02-19 ENCOUNTER — Telehealth: Payer: Self-pay

## 2022-02-19 NOTE — Telephone Encounter (Signed)
Caller name:Airam Starleen Blue   On DPR? :No  Call back [phone]  Provider they see: Lacinda Axon  Reason for call:Pt stopped by to dropped off copy of lab test and also wanted to see she said her last visit she thought Dr Lacinda Axon wanted to order more labs? Pt also wanted me to inform Dr Lacinda Axon that her PT from [place] is completed as well.

## 2022-02-25 ENCOUNTER — Other Ambulatory Visit: Payer: Self-pay | Admitting: Family Medicine

## 2022-02-25 MED ORDER — FLUCONAZOLE 150 MG PO TABS: ORAL_TABLET | ORAL | 0 refills | Status: DC

## 2022-02-25 NOTE — Progress Notes (Signed)
Pt calling in to request something for yeast infection. Pt states she is having itching, no other symptoms. Diflucan sent in per protocol to [place].

## 2022-05-21 ENCOUNTER — Encounter: Payer: Self-pay | Admitting: Family Medicine

## 2022-05-21 ENCOUNTER — Ambulatory Visit (INDEPENDENT_AMBULATORY_CARE_PROVIDER_SITE_OTHER): Payer: Medicare HMO | Admitting: Family Medicine

## 2022-05-21 VITALS — BP 139/87 | HR 92 | Temp 97.7°F | Wt 210.2 lb

## 2022-05-21 DIAGNOSIS — G4700 Insomnia, unspecified: Secondary | ICD-10-CM | POA: Diagnosis not present

## 2022-05-21 DIAGNOSIS — I10 Essential (primary) hypertension: Secondary | ICD-10-CM | POA: Diagnosis not present

## 2022-05-21 DIAGNOSIS — J4540 Moderate persistent asthma, uncomplicated: Secondary | ICD-10-CM

## 2022-05-21 MED ORDER — TRIAMCINOLONE ACETONIDE 0.1 % EX OINT: 1.0000 | TOPICAL_OINTMENT | Freq: Two times a day (BID) | CUTANEOUS | 0 refills | Status: AC | PRN

## 2022-05-21 MED ORDER — TRAZODONE HCL 150 MG PO TABS: 150.0000 mg | ORAL_TABLET | Freq: Every day | ORAL | 1 refills | Status: DC

## 2022-05-21 NOTE — Patient Instructions (Signed)
I have increased the trazodone.  Continue your other medications.  Follow up in 6 months or sooner if needed.  Take care  Dr. Adriana Simas

## 2022-05-26 DIAGNOSIS — G4700 Insomnia, unspecified: Secondary | ICD-10-CM | POA: Insufficient documentation

## 2022-05-26 NOTE — Assessment & Plan Note (Signed)
Stable

## 2022-05-26 NOTE — Assessment & Plan Note (Signed)
Stable. °-Continue amlodipine °

## 2022-05-26 NOTE — Assessment & Plan Note (Signed)
Increasing trazodone to 150 mg nightly.
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
## 2022-06-07 ENCOUNTER — Telehealth: Payer: Self-pay

## 2022-06-07 ENCOUNTER — Other Ambulatory Visit: Payer: Self-pay | Admitting: Family Medicine

## 2022-06-07 MED ORDER — DILTIAZEM HCL ER COATED BEADS 360 MG PO CP24: 360.0000 mg | ORAL_CAPSULE | Freq: Every day | ORAL | 3 refills | Status: DC

## 2022-06-07 NOTE — Telephone Encounter (Signed)
Patient calls and states she feels she is having side effects from her current blood pressure medication amlodipine- fatigue, dizziness, nausea, headaches started 1 to 2 weeks ago, patient started taking amlodipine back after first visit , and would like to go back on diltiazem 360 mg , please advise.

## 2022-06-07 NOTE — Telephone Encounter (Signed)
Patient has been informed per provider orders and recommendations.

## 2022-06-23 ENCOUNTER — Telehealth: Payer: Self-pay | Admitting: Family Medicine

## 2022-06-23 NOTE — Telephone Encounter (Signed)
  Left message for patient to call back and schedule Medicare Annual Wellness Visit (AWV) in office.   If unable to come into the office for AWV,  please offer to do virtually or by telephone.  No hx of AWV eligible for AWVI per [facility] as of  05/31/2009   Please schedule at anytime with [facility]-Nurse Health Advisor.      45 minute appointment   Any questions, please call me.  Thank you,   Stephanie  Ambulatory Clinical Support for [facility] /[facility] You Are. We Are. One [facility] ??[phone] or ??[phone] 

## 2022-09-21 ENCOUNTER — Telehealth: Payer: Self-pay | Admitting: Family Medicine

## 2022-09-21 NOTE — Telephone Encounter (Signed)
Called patient to schedule Medicare Annual Wellness Visit (AWV). Left message for patient to call back and schedule Medicare Annual Wellness Visit (AWV).  Last date of AWV: due 05/31/2009 awvi per [facility]  Please schedule an appointment at any time with Courtney, NHA .  If any questions, please contact me at [phone].  Thank you,  Stephanie,  AMB Clinical Support [facility] AWV Program Direct Dial ??[phone]    

## 2022-11-19 ENCOUNTER — Ambulatory Visit: Payer: Medicare HMO | Admitting: Family Medicine

## 2022-11-24 ENCOUNTER — Ambulatory Visit: Payer: Medicare HMO | Admitting: Family Medicine

## 2023-01-08 IMAGING — DX DG LUMBAR SPINE COMPLETE 4+V
4 series · 4 of 4 positions shown · non-contrast
Comparison: Lumbar x-ray 01/14/2009.

CLINICAL DATA: lower back pain

EXAM:
LUMBAR SPINE - COMPLETE 4+ VIEW

[lumbar spine ap]
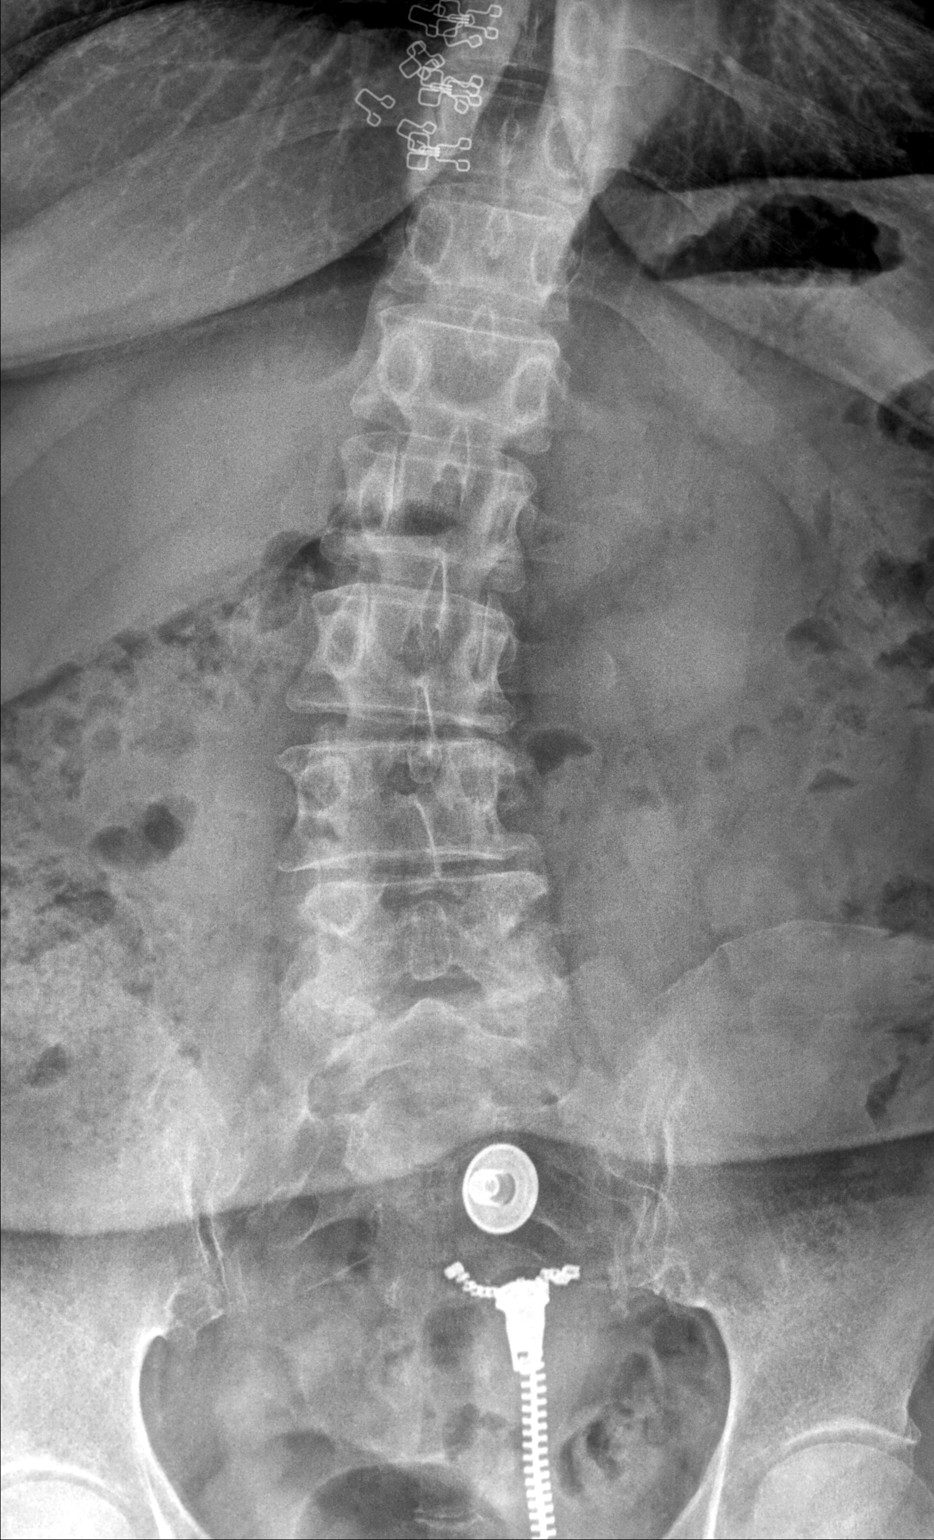

[lumbar spine mlo (1 of 2)]
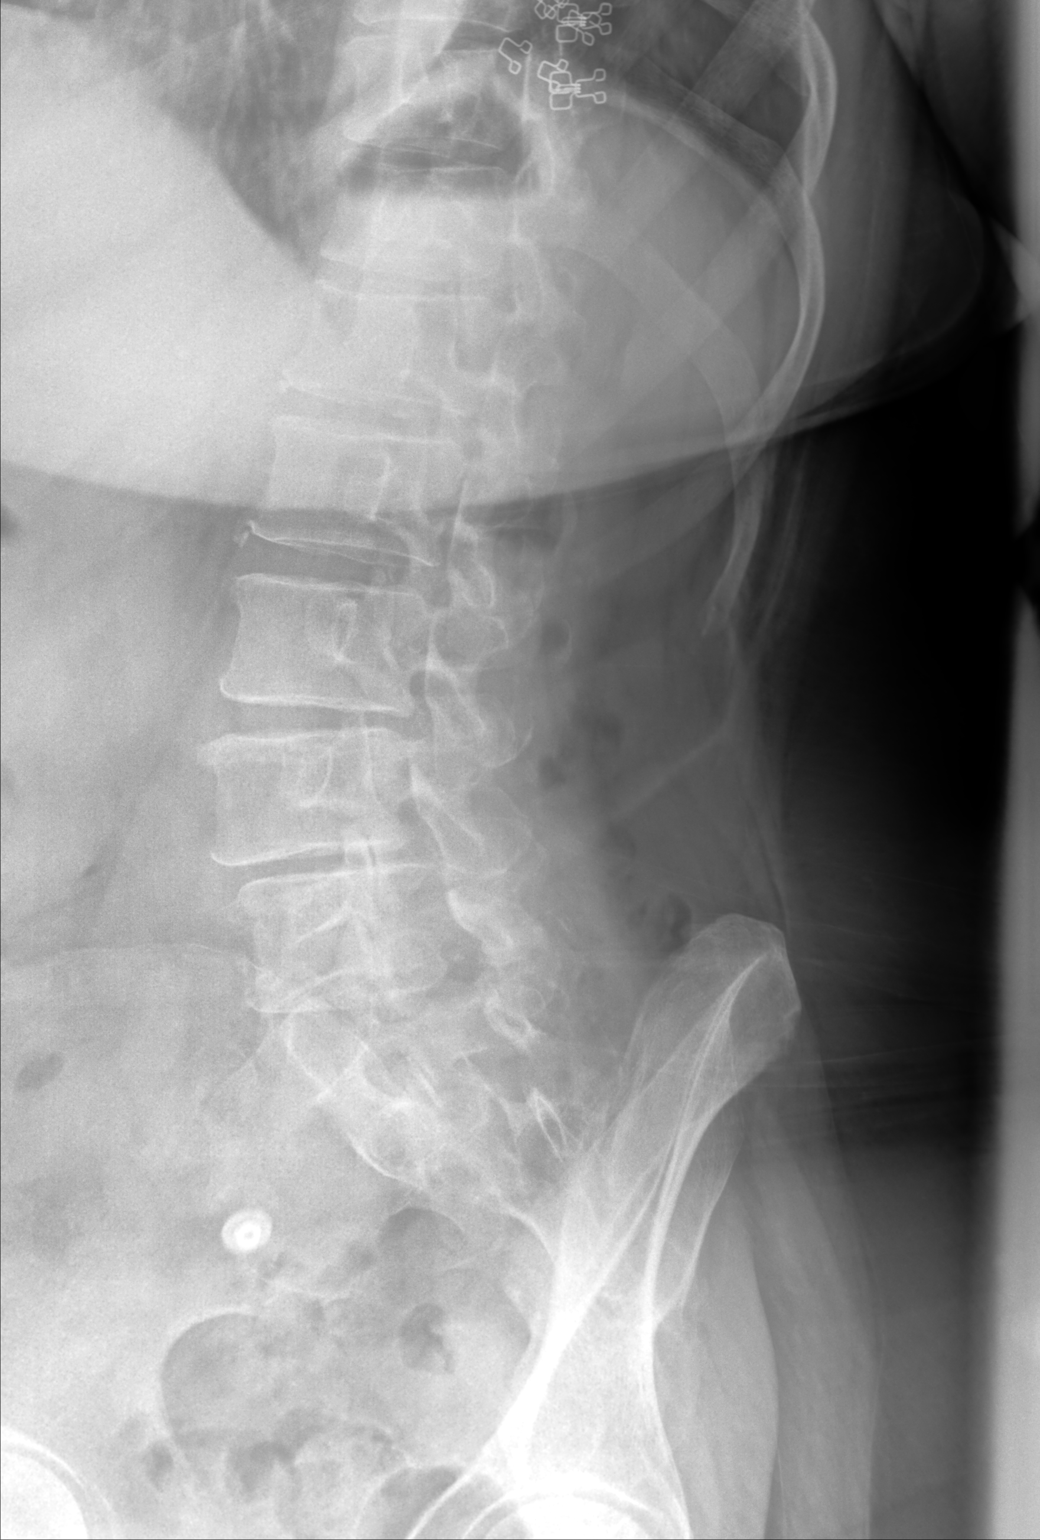

[lumbar spine mlo (2 of 2)]
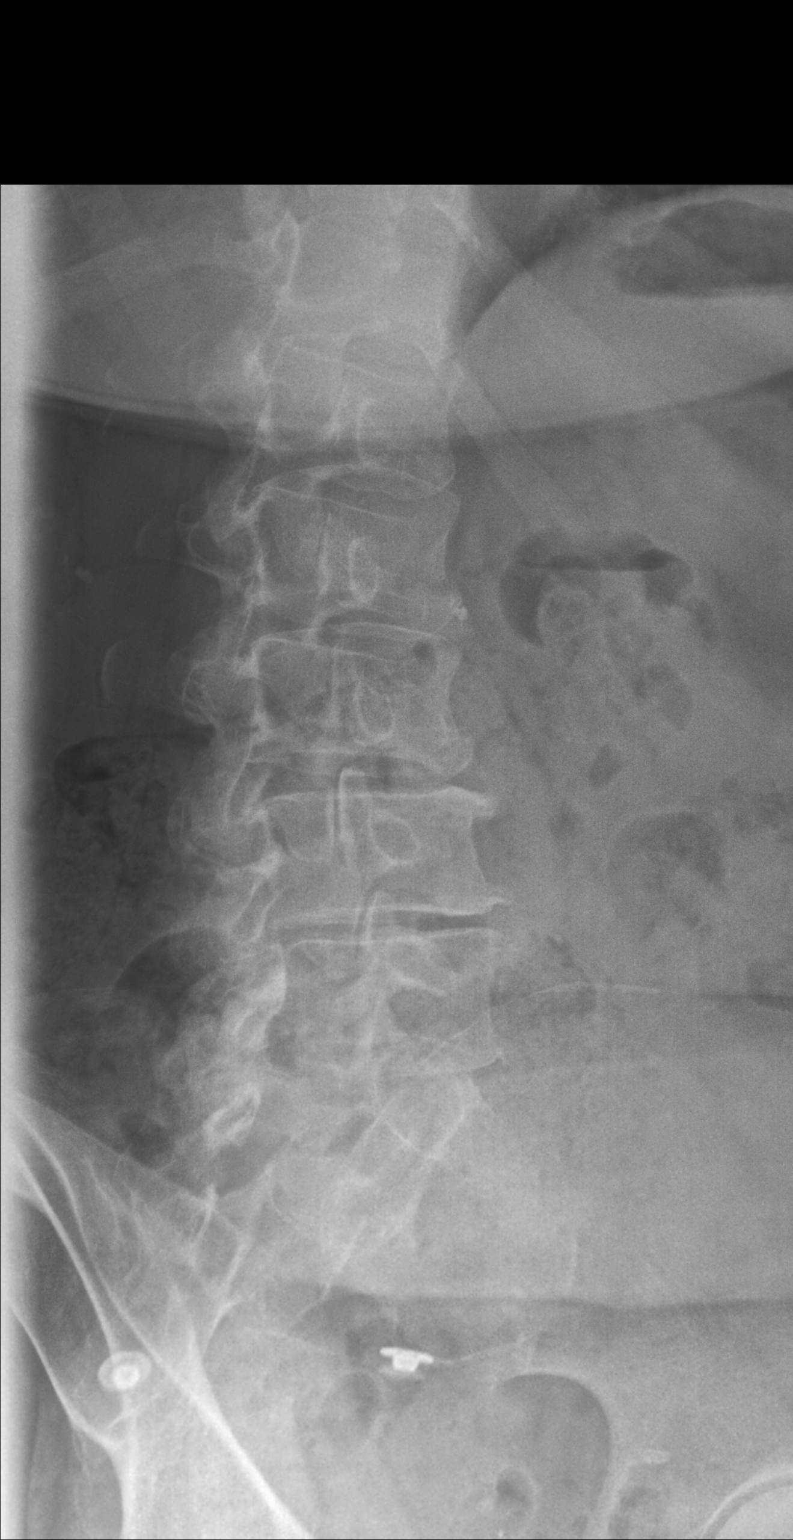

[lumbar spine lat]
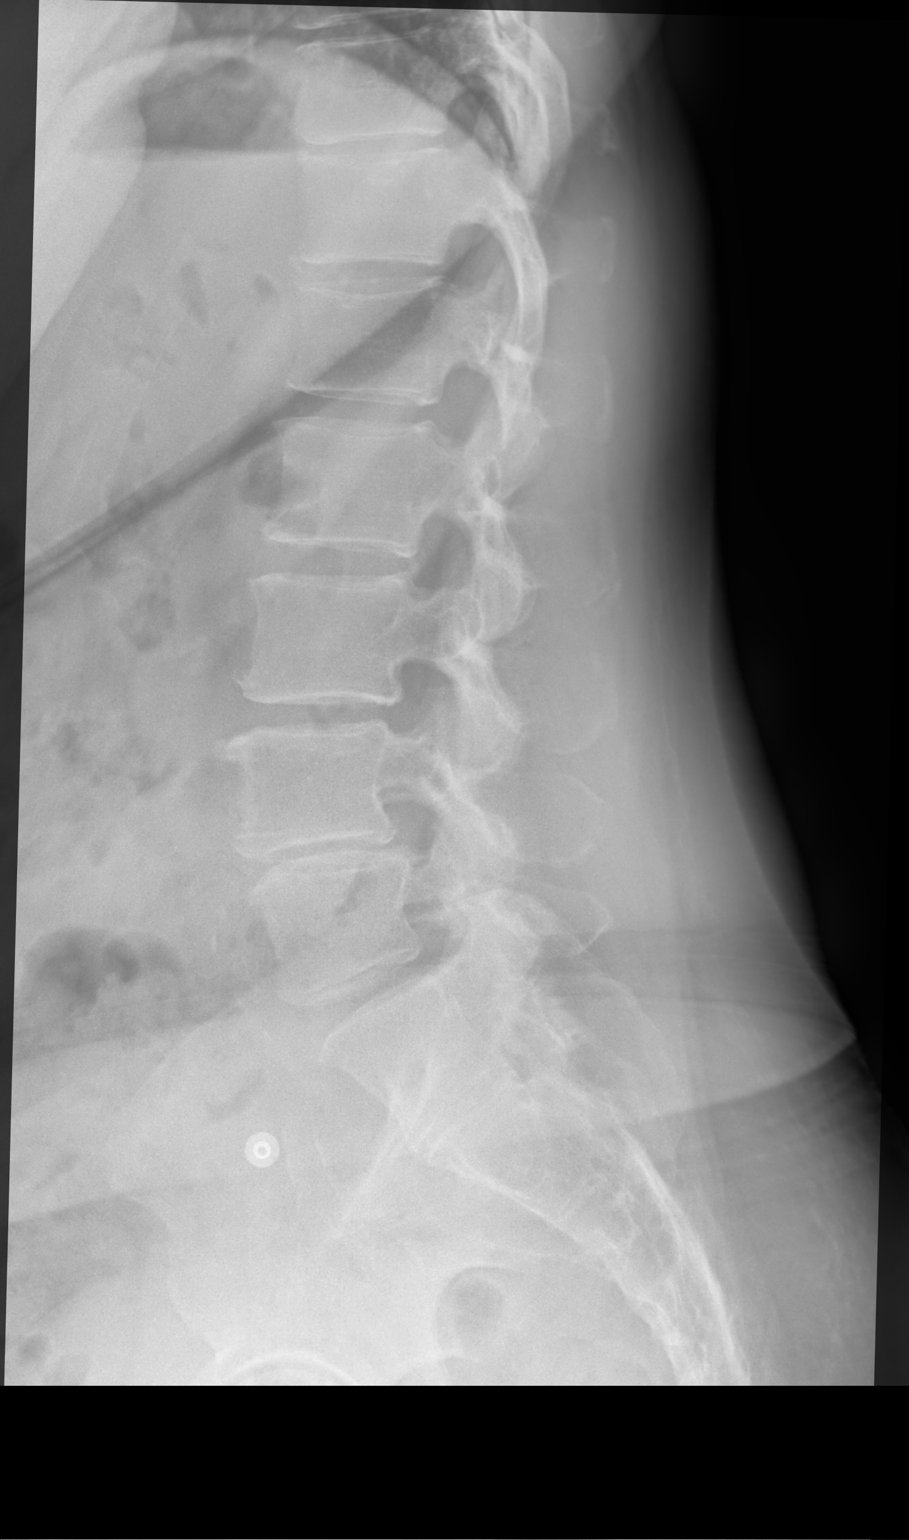

[4 of 4 positions shown; findings below may reference images not displayed]

FINDINGS: Mild L1 superior endplate deformity and vertebral body height loss,
compatible with age indeterminate fracture that is new since 9939.
Broad levocurvature. Grade 1 anterolisthesis of L4 on L5, likely
degenerative given at least moderate degenerative changes at this
level (including degenerative disc height loss, endplate spurring,
and facet arthropathy).
IMPRESSION: 1. Mild L1 superior endplate deformity and vertebral body height
loss, compatible with age indeterminate fracture that is new since
9939. If the patient has had recent trauma or if there is concern
for acute fracture, recommend cross-sectional imaging for further
evaluation.
2. Grade 1 anterolisthesis of L4 on L5 with at least moderate
degenerative change at this level. An MRI could better characterize
the canal/foramina if clinically warranted.

## 2023-02-24 ENCOUNTER — Ambulatory Visit
Admission: EM | Admit: 2023-02-24 | Discharge: 2023-02-24 | Disposition: A | Discharge status: Home / Self Care | Payer: Medicare PPO | Attending: Family Medicine | Admitting: Family Medicine

## 2023-02-24 DIAGNOSIS — H00011 Hordeolum externum right upper eyelid: Secondary | ICD-10-CM | POA: Diagnosis not present

## 2023-02-24 MED ORDER — ERYTHROMYCIN 5 MG/GM OP OINT: TOPICAL_OINTMENT | OPHTHALMIC | 0 refills | Status: DC

## 2023-02-24 NOTE — ED Triage Notes (Signed)
Pt reports she has a red raised right eye bump and pain x 2 days. Hurts to blink and bat eyes
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
## 2023-04-02 ENCOUNTER — Other Ambulatory Visit: Payer: Self-pay | Admitting: Family Medicine

## 2023-07-11 ENCOUNTER — Other Ambulatory Visit: Payer: Self-pay | Admitting: Family Medicine

## 2023-07-12 ENCOUNTER — Other Ambulatory Visit: Payer: Self-pay

## 2023-07-12 MED ORDER — CETIRIZINE HCL 10 MG PO TABS: 10.0000 mg | ORAL_TABLET | Freq: Every day | ORAL | 11 refills | Status: AC

## 2023-07-24 ENCOUNTER — Other Ambulatory Visit: Payer: Self-pay | Admitting: Family Medicine

## 2023-07-29 ENCOUNTER — Other Ambulatory Visit: Payer: Self-pay | Admitting: Family Medicine

## 2023-07-29 NOTE — Telephone Encounter (Unsigned)
 Copied from CRM [id number]. Topic: Clinical - Medication Refill >> Jul 29, 2023  3:09 PM Santiya F wrote: Most Recent Primary Care Visit:  Provider: Tommie Sams  Department: [facility]  Visit Type: OFFICE VISIT  Date: 05/21/2022  Medication: diltiazem (CARDIZEM CD) 360 MG 24 hr capsule [Pharmacy Med Name: dilTIAZem HCl ER Coated Beads 360 MG Oral Capsule Extended Release 24 Hour] [[id number]]  Has the patient contacted their pharmacy? Yes  (Agent: If yes, when and what did the pharmacy advise?) Contact office   Is this the correct pharmacy for this prescription? Yes  This is the patient's preferred pharmacy:  [facility] [address] [place] - [address] [place] [address] Phone: [phone] Fax: [phone]   Has the prescription been filled recently? Yes  Is the patient out of the medication? Yes  Has the patient been seen for an appointment in the last year OR does the patient have an upcoming appointment? Yes  Can we respond through MyChart? No  Agent: Please be advised that Rx refills may take up to 3 business days. We ask that you follow-up with your pharmacy.   Patient is requesting a short supply to last until her appointment. Patient has been out of meds for a week.

## 2023-08-01 ENCOUNTER — Other Ambulatory Visit: Payer: Self-pay

## 2023-08-01 MED ORDER — DILTIAZEM HCL ER COATED BEADS 360 MG PO CP24: 360.0000 mg | ORAL_CAPSULE | Freq: Every day | ORAL | 3 refills | Status: AC

## 2023-08-11 ENCOUNTER — Ambulatory Visit: Payer: Medicare PPO | Admitting: Family Medicine

## 2023-10-04 ENCOUNTER — Ambulatory Visit: Admitting: Family Medicine

## 2023-10-11 ENCOUNTER — Ambulatory Visit: Admitting: Family Medicine

## 2024-01-24 ENCOUNTER — Ambulatory Visit: Admitting: Family Medicine

## 2024-05-21 ENCOUNTER — Ambulatory Visit: Admitting: Family Medicine

## 2024-05-21 ENCOUNTER — Encounter: Payer: Self-pay | Admitting: Family Medicine

## 2024-05-21 VITALS — BP 115/71 | HR 78 | Temp 98.0°F | Ht 65.0 in | Wt 222.2 lb

## 2024-05-21 DIAGNOSIS — H6123 Impacted cerumen, bilateral: Secondary | ICD-10-CM | POA: Diagnosis not present

## 2024-05-21 DIAGNOSIS — J45909 Unspecified asthma, uncomplicated: Secondary | ICD-10-CM | POA: Diagnosis not present

## 2024-05-21 MED ORDER — PREDNISONE 50 MG PO TABS: 50.0000 mg | ORAL_TABLET | Freq: Every day | ORAL | 0 refills | Status: AC

## 2024-05-21 MED ORDER — PROMETHAZINE-DM 6.25-15 MG/5ML PO SYRP: 5.0000 mL | ORAL_SOLUTION | Freq: Four times a day (QID) | ORAL | 0 refills | Status: AC | PRN

## 2024-05-21 NOTE — Progress Notes (Signed)
 "  Subjective:  Patient ID: Shelley Hoffman, female    DOB: [birth year]  Age: 58 y.o. MRN: [id number]  CC:   Chief Complaint  Patient presents with   Acute Visit    Cough and congestion  Would like to try a prescription of Breztri     HPI:  58 year old female presents for evaluation of the above.  1.5 week history of cough, congestion. Cough is productive. Has also had runny nose. No documented fever. No resolution with OTC treatment. Has known asthma.  Also, reports difficulty hearing out of her left ear.   Patient Active Problem List   Diagnosis Date Noted   Asthmatic bronchitis 05/21/2024   Bilateral impacted cerumen 05/21/2024   Insomnia 05/26/2022   Obesity (BMI 30-39.9) 11/10/2021   TBI (traumatic brain injury) (HCC) 11/10/2021   Asthma 11/10/2021   Benign essential HTN 07/07/2017   HLD (hyperlipidemia) 07/07/2017    Social Hx   Social History   Socioeconomic History   Marital status: Single    Spouse name: Not on file   Number of children: Not on file   Years of education: Not on file   Highest education level: Not on file  Occupational History   Not on file  Tobacco Use   Smoking status: Former   Smokeless tobacco: Never  Vaping Use   Vaping status: Never Used  Substance and Sexual Activity   Alcohol  use: No   Drug use: No   Sexual activity: Not on file  Other Topics Concern   Not on file  Social History Narrative   Not on file   Social Drivers of Health   Tobacco Use: Medium Risk (05/21/2024)   Patient History    Smoking Tobacco Use: Former    Smokeless Tobacco Use: Never    Passive Exposure: Not on Actuary Strain: Not on file  Food Insecurity: Not on file  Transportation Needs: Not on file  Physical Activity: Not on file  Stress: Not on file  Social Connections: Not on file  Depression (PHQ2-9): Medium Risk (05/21/2024)   Depression (PHQ2-9)    PHQ-2 Score: 7  Alcohol  Screen: Not on file  Housing: Not on file  Utilities:  Not on file  Health Literacy: Not on file    Review of Systems Per HPI  Objective:  BP 115/71   Pulse 78   Temp 98 F (36.7 C)   Ht 5' 5 (1.651 m)   Wt 222 lb 3.2 oz (100.8 kg)   LMP 05/22/2013   SpO2 95%   BMI 36.98 kg/m      05/21/2024    4:26 PM 02/24/2023    4:07 PM 05/21/2022    2:02 PM  BP/Weight  Systolic BP 115 151 139  Diastolic BP 71 94 87  Wt. (Lbs) 222.2  210.2  BMI 36.98 kg/m2  34.98 kg/m2    Physical Exam Vitals and nursing note reviewed.  Constitutional:      General: She is not in acute distress.    Appearance: Normal appearance.  HENT:     Head: Normocephalic and atraumatic.     Right Ear: There is impacted cerumen.     Left Ear: There is impacted cerumen.  Cardiovascular:     Rate and Rhythm: Normal rate and regular rhythm.  Pulmonary:     Effort: Pulmonary effort is normal.     Breath sounds: Normal breath sounds. No wheezing or rales.  Neurological:     Mental Status:  She is alert.     Lab Results  Component Value Date   WBC 6.8 11/10/2021   HGB 12.5 11/10/2021   HCT 38.0 11/10/2021   PLT 312 11/10/2021   GLUCOSE 79 11/10/2021   CHOL 266 (H) 11/10/2021   TRIG 115 11/10/2021   HDL 86 11/10/2021   LDLCALC 160 (H) 11/10/2021   ALT 26 11/10/2021   AST 19 11/10/2021   NA 145 (H) 11/10/2021   K 4.4 11/10/2021   CL 106 11/10/2021   CREATININE 0.83 11/10/2021   BUN 15 11/10/2021   CO2 27 11/10/2021   HGBA1C 5.5 11/10/2021     Assessment & Plan:  Asthmatic bronchitis without complication, unspecified asthma severity, unspecified whether persistent Assessment & Plan: Treating with Prednisone  and Promethazine  DM.  Orders: -     predniSONE ; Take 1 tablet (50 mg total) by mouth daily for 5 days.  Dispense: 5 tablet; Refill: 0 -     Promethazine -DM; Take 5 mLs by mouth 4 (four) times daily as needed.  Dispense: 118 mL; Refill: 0  Bilateral impacted cerumen Assessment & Plan: Unsuccessful lavage today.  Referring to  ENT.  Orders: -     Ambulatory referral to ENT    Follow-up:  Return if symptoms worsen or fail to improve.  Jacqulyn Ahle DO [facility] "

## 2024-05-21 NOTE — Assessment & Plan Note (Signed)
Treating with Prednisone and Promethazine DM.

## 2024-05-21 NOTE — Assessment & Plan Note (Signed)
 Unsuccessful lavage today.  Referring to ENT.

## 2024-05-22 ENCOUNTER — Other Ambulatory Visit: Payer: Self-pay | Admitting: Family Medicine

## 2024-05-22 MED ORDER — ALBUTEROL SULFATE HFA 108 (90 BASE) MCG/ACT IN AERS: 2.0000 | INHALATION_SPRAY | Freq: Four times a day (QID) | RESPIRATORY_TRACT | 0 refills | Status: AC | PRN

## 2024-05-22 NOTE — Telephone Encounter (Signed)
 Copied from CRM #[id number]. Topic: Clinical - Medication Refill >> May 22, 2024 10:29 AM Sophia H wrote: Medication: albuterol  (VENTOLIN  HFA) 108 (90 Base) MCG/ACT inhaler  Has the patient contacted their pharmacy? Yes, no fills on file   This is the patient's preferred pharmacy:  [facility] [address] [place] - [address] [place] [address] Phone: [phone] Fax: [phone]  Is this the correct pharmacy for this prescription? Yes If no, delete pharmacy and type the correct one.   Has the prescription been filled recently? Yes  Is the patient out of the medication? Yes  Has the patient been seen for an appointment in the last year OR does the patient have an upcoming appointment? Yes, seen yesterday 12/22.   Can we respond through MyChart? Yes  Agent: Please be advised that Rx refills may take up to 3 business days. We ask that you follow-up with your pharmacy.

## 2024-06-08 ENCOUNTER — Institutional Professional Consult (permissible substitution) (INDEPENDENT_AMBULATORY_CARE_PROVIDER_SITE_OTHER): Admitting: Physician Assistant

## 2024-06-12 ENCOUNTER — Encounter (INDEPENDENT_AMBULATORY_CARE_PROVIDER_SITE_OTHER): Payer: Self-pay | Admitting: Physician Assistant
